# Patient Record
Sex: Female | Born: 1980 | Race: White | Hispanic: No | Marital: Married | Smoking: Never smoker
Health system: Southern US, Community
[De-identification: ages and names within clinical notes are randomized; demographics above are authoritative.]

## PROBLEM LIST (undated history)

## (undated) DIAGNOSIS — Z8742 Personal history of other diseases of the female genital tract: Secondary | ICD-10-CM

## (undated) DIAGNOSIS — Z5189 Encounter for other specified aftercare: Secondary | ICD-10-CM

## (undated) DIAGNOSIS — R519 Headache, unspecified: Secondary | ICD-10-CM

## (undated) DIAGNOSIS — K219 Gastro-esophageal reflux disease without esophagitis: Secondary | ICD-10-CM

## (undated) HISTORY — DX: Gastro-esophageal reflux disease without esophagitis: K21.9

## (undated) HISTORY — DX: Headache, unspecified: R51.9

## (undated) HISTORY — PX: NO PAST SURGERIES: SHX2092

## (undated) HISTORY — DX: Encounter for other specified aftercare: Z51.89

## (undated) HISTORY — DX: Personal history of other diseases of the female genital tract: Z87.42

---

## 2018-08-25 ENCOUNTER — Encounter: Payer: Self-pay | Admitting: Maternal Newborn

## 2018-11-05 NOTE — L&D Delivery Note (Signed)
Delivery Note At  19 a viable female infant was delivered over intact perineum via svd (Presentation:cephalic ;OA  ).  APGAR:9 ,9 ; weight  .8lb 6oz   Placenta status: delivered, spontaneously,intact .  Cord:  3vv, with the following complications: none .  Anesthesia:   Local, lidocaine 1% Episiotomy:  none Lacerations:  third degree, right labial; perineal laceration repaired in standard fashion and hemostatic Suture Repair: 3.0 vicryl, 0 vicryl Est. Blood Loss (mL):  944ml   Good uterine tone, many small bleeding vessels that contributed to the EBL.  Lacerations all hemostatic after repair and good uterine tone, normal lochia at repair end.  Digital rectal exam to confirm third degree, repeat exam after repair and wnl with both exams. Mom to postpartum.  Baby to Couplet care / Skin to Skin.  [name] 09/16/2019, 6:08 PM

## 2019-03-09 LAB — OB RESULTS CONSOLE GC/CHLAMYDIA
Chlamydia: NEGATIVE
Gonorrhea: NEGATIVE

## 2019-03-09 LAB — OB RESULTS CONSOLE HIV ANTIBODY (ROUTINE TESTING): HIV: NONREACTIVE

## 2019-03-09 LAB — OB RESULTS CONSOLE HEPATITIS B SURFACE ANTIGEN: Hepatitis B Surface Ag: NEGATIVE

## 2019-03-09 LAB — OB RESULTS CONSOLE RUBELLA ANTIBODY, IGM: Rubella: IMMUNE

## 2019-03-09 LAB — OB RESULTS CONSOLE RPR: RPR: NONREACTIVE

## 2019-03-09 LAB — OB RESULTS CONSOLE ABO/RH: RH Type: POSITIVE

## 2019-03-09 LAB — OB RESULTS CONSOLE ANTIBODY SCREEN: Antibody Screen: NEGATIVE

## 2019-08-24 LAB — OB RESULTS CONSOLE GBS: GBS: NEGATIVE

## 2019-09-09 ENCOUNTER — Other Ambulatory Visit: Payer: Self-pay | Admitting: Advanced Practice Midwife

## 2019-09-14 ENCOUNTER — Encounter (HOSPITAL_COMMUNITY): Payer: Self-pay | Admitting: *Deleted

## 2019-09-14 ENCOUNTER — Other Ambulatory Visit: Payer: Self-pay | Admitting: Obstetrics and Gynecology

## 2019-09-14 ENCOUNTER — Telehealth (HOSPITAL_COMMUNITY): Payer: Self-pay | Admitting: *Deleted

## 2019-09-14 NOTE — Telephone Encounter (Signed)
Preadmission screen  

## 2019-09-15 ENCOUNTER — Other Ambulatory Visit: Payer: Self-pay

## 2019-09-15 ENCOUNTER — Other Ambulatory Visit (HOSPITAL_COMMUNITY)
Admission: RE | Admit: 2019-09-15 | Discharge: 2019-09-15 | Disposition: A | Discharge status: Home / Self Care | Payer: POS | Source: Ambulatory Visit | Attending: Obstetrics and Gynecology | Admitting: Obstetrics and Gynecology

## 2019-09-15 LAB — SARS CORONAVIRUS 2 (TAT 6-24 HRS): SARS Coronavirus 2: NEGATIVE

## 2019-09-15 NOTE — MAU Note (Signed)
Asymptomatic, swab collected. 

## 2019-09-16 ENCOUNTER — Inpatient Hospital Stay (HOSPITAL_COMMUNITY)
Admission: AD | Admit: 2019-09-16 | Discharge: 2019-09-18 | DRG: 768 | Disposition: A | Discharge status: Home / Self Care | Payer: POS | Attending: Obstetrics and Gynecology | Admitting: Obstetrics and Gynecology

## 2019-09-16 ENCOUNTER — Inpatient Hospital Stay (HOSPITAL_COMMUNITY): Payer: POS | Admitting: Anesthesiology

## 2019-09-16 ENCOUNTER — Encounter (HOSPITAL_COMMUNITY): Payer: Self-pay | Admitting: *Deleted

## 2019-09-16 ENCOUNTER — Encounter (HOSPITAL_COMMUNITY): Payer: Self-pay | Admitting: Anesthesiology

## 2019-09-16 ENCOUNTER — Encounter (HOSPITAL_COMMUNITY)
Admission: AD | Disposition: A | Discharge status: Home / Self Care | Payer: Self-pay | Source: Home / Self Care | Attending: Obstetrics and Gynecology

## 2019-09-16 ENCOUNTER — Inpatient Hospital Stay (HOSPITAL_COMMUNITY): Payer: POS

## 2019-09-16 ENCOUNTER — Other Ambulatory Visit: Payer: Self-pay

## 2019-09-16 DIAGNOSIS — O165 Unspecified maternal hypertension, complicating the puerperium: Secondary | ICD-10-CM | POA: Diagnosis not present

## 2019-09-16 DIAGNOSIS — D62 Acute posthemorrhagic anemia: Secondary | ICD-10-CM | POA: Diagnosis not present

## 2019-09-16 DIAGNOSIS — O3663X0 Maternal care for excessive fetal growth, third trimester, not applicable or unspecified: Secondary | ICD-10-CM | POA: Diagnosis present

## 2019-09-16 DIAGNOSIS — Z3A39 39 weeks gestation of pregnancy: Secondary | ICD-10-CM

## 2019-09-16 DIAGNOSIS — Z20828 Contact with and (suspected) exposure to other viral communicable diseases: Secondary | ICD-10-CM | POA: Diagnosis present

## 2019-09-16 DIAGNOSIS — N898 Other specified noninflammatory disorders of vagina: Secondary | ICD-10-CM | POA: Diagnosis not present

## 2019-09-16 DIAGNOSIS — O9081 Anemia of the puerperium: Secondary | ICD-10-CM | POA: Diagnosis not present

## 2019-09-16 DIAGNOSIS — O9902 Anemia complicating childbirth: Secondary | ICD-10-CM | POA: Diagnosis not present

## 2019-09-16 DIAGNOSIS — O26893 Other specified pregnancy related conditions, third trimester: Secondary | ICD-10-CM | POA: Diagnosis present

## 2019-09-16 DIAGNOSIS — Z3490 Encounter for supervision of normal pregnancy, unspecified, unspecified trimester: Secondary | ICD-10-CM | POA: Diagnosis present

## 2019-09-16 HISTORY — PX: HEMATOMA EVACUATION: SHX5118

## 2019-09-16 HISTORY — PX: PERINEAL LACERATION REPAIR: SHX5389

## 2019-09-16 LAB — CBC
HCT: 28.1 % — ABNORMAL LOW (ref 36.0–46.0)
HCT: 30.8 % — ABNORMAL LOW (ref 36.0–46.0)
HCT: 34.1 % — ABNORMAL LOW (ref 36.0–46.0)
Hemoglobin: 10.3 g/dL — ABNORMAL LOW (ref 12.0–15.0)
Hemoglobin: 11.4 g/dL — ABNORMAL LOW (ref 12.0–15.0)
Hemoglobin: 9.3 g/dL — ABNORMAL LOW (ref 12.0–15.0)
MCH: 28.2 pg (ref 26.0–34.0)
MCH: 28.4 pg (ref 26.0–34.0)
MCH: 28.5 pg (ref 26.0–34.0)
MCHC: 33.1 g/dL (ref 30.0–36.0)
MCHC: 33.4 g/dL (ref 30.0–36.0)
MCHC: 33.4 g/dL (ref 30.0–36.0)
MCV: 84.4 fL (ref 80.0–100.0)
MCV: 85.3 fL (ref 80.0–100.0)
MCV: 85.9 fL (ref 80.0–100.0)
Platelets: 291 10*3/uL (ref 150–400)
Platelets: 307 10*3/uL (ref 150–400)
Platelets: 337 10*3/uL (ref 150–400)
RBC: 3.27 MIL/uL — ABNORMAL LOW (ref 3.87–5.11)
RBC: 3.61 MIL/uL — ABNORMAL LOW (ref 3.87–5.11)
RBC: 4.04 MIL/uL (ref 3.87–5.11)
RDW: 12.4 % (ref 11.5–15.5)
RDW: 12.4 % (ref 11.5–15.5)
RDW: 12.5 % (ref 11.5–15.5)
WBC: 10.1 10*3/uL (ref 4.0–10.5)
WBC: 13.4 10*3/uL — ABNORMAL HIGH (ref 4.0–10.5)
WBC: 20.4 10*3/uL — ABNORMAL HIGH (ref 4.0–10.5)
nRBC: 0 % (ref 0.0–0.2)
nRBC: 0 % (ref 0.0–0.2)
nRBC: 0 % (ref 0.0–0.2)

## 2019-09-16 LAB — COMPREHENSIVE METABOLIC PANEL
ALT: 15 U/L (ref 0–44)
AST: 21 U/L (ref 15–41)
Albumin: 2.7 g/dL — ABNORMAL LOW (ref 3.5–5.0)
Alkaline Phosphatase: 140 U/L — ABNORMAL HIGH (ref 38–126)
Anion gap: 10 (ref 5–15)
BUN: 10 mg/dL (ref 6–20)
CO2: 21 mmol/L — ABNORMAL LOW (ref 22–32)
Calcium: 9.1 mg/dL (ref 8.9–10.3)
Chloride: 105 mmol/L (ref 98–111)
Creatinine, Ser: 0.73 mg/dL (ref 0.44–1.00)
GFR calc Af Amer: >60 mL/min (ref >60)
GFR calc non Af Amer: >60 mL/min (ref >60)
Glucose, Bld: 96 mg/dL (ref 70–99)
Potassium: 3.4 mmol/L — ABNORMAL LOW (ref 3.5–5.1)
Sodium: 136 mmol/L (ref 135–145)
Total Bilirubin: 0.4 mg/dL (ref 0.3–1.2)
Total Protein: 6 g/dL — ABNORMAL LOW (ref 6.5–8.1)

## 2019-09-16 LAB — RPR: RPR Ser Ql: NONREACTIVE

## 2019-09-16 LAB — PREPARE RBC (CROSSMATCH)

## 2019-09-16 LAB — ABO/RH: ABO/RH(D): O POS

## 2019-09-16 SURGERY — SUTURE REPAIR, LACERATION, PERINEUM
Anesthesia: General | Site: Vagina

## 2019-09-16 MED ORDER — SODIUM CHLORIDE 0.9 % IV SOLN
INTRAVENOUS | Status: DC | PRN
  Administered 2019-09-16: 21:00:00 via INTRAVENOUS

## 2019-09-16 MED ORDER — SODIUM CHLORIDE 0.9% IV SOLUTION
Freq: Once | INTRAVENOUS | Status: AC
  Administered 2019-09-17: 10:00:00 via INTRAVENOUS

## 2019-09-16 MED ORDER — LACTATED RINGERS IV SOLN: 500.0000 mL | INTRAVENOUS | Status: DC | PRN

## 2019-09-16 MED ORDER — PROPOFOL 10 MG/ML IV BOLUS
INTRAVENOUS | Status: DC | PRN
  Administered 2019-09-16: 200 mg via INTRAVENOUS

## 2019-09-16 MED ORDER — EPHEDRINE 5 MG/ML INJ: 10.0000 mg | INTRAVENOUS | Status: DC | PRN

## 2019-09-16 MED ORDER — COCONUT OIL OIL: 1.0000 "application " | TOPICAL_OIL | Status: DC | PRN

## 2019-09-16 MED ORDER — ONDANSETRON HCL 4 MG/2ML IJ SOLN: 4.0000 mg | INTRAMUSCULAR | Status: DC | PRN

## 2019-09-16 MED ORDER — PHENYLEPHRINE HCL-NACL 20-0.9 MG/250ML-% IV SOLN
INTRAVENOUS | Status: AC
  Filled 2019-09-16: qty 250

## 2019-09-16 MED ORDER — FENTANYL CITRATE (PF) 100 MCG/2ML IJ SOLN: 100.0000 ug | Freq: Once | INTRAMUSCULAR | Status: DC

## 2019-09-16 MED ORDER — ACETAMINOPHEN 10 MG/ML IV SOLN
INTRAVENOUS | Status: AC
  Filled 2019-09-16: qty 100

## 2019-09-16 MED ORDER — SENNOSIDES-DOCUSATE SODIUM 8.6-50 MG PO TABS
2.0000 | ORAL_TABLET | ORAL | Status: DC
  Administered 2019-09-16 – 2019-09-17 (×2): 2 via ORAL
  Filled 2019-09-16 (×2): qty 2

## 2019-09-16 MED ORDER — WITCH HAZEL-GLYCERIN EX PADS: 1.0000 "application " | MEDICATED_PAD | CUTANEOUS | Status: DC | PRN

## 2019-09-16 MED ORDER — IBUPROFEN 600 MG PO TABS
600.0000 mg | ORAL_TABLET | Freq: Four times a day (QID) | ORAL | Status: DC
  Administered 2019-09-17 – 2019-09-18 (×7): 600 mg via ORAL
  Filled 2019-09-16 (×7): qty 1

## 2019-09-16 MED ORDER — CEFAZOLIN SODIUM-DEXTROSE 2-3 GM-%(50ML) IV SOLR
INTRAVENOUS | Status: DC | PRN
  Administered 2019-09-16: 2 g via INTRAVENOUS

## 2019-09-16 MED ORDER — PROPOFOL 10 MG/ML IV BOLUS
INTRAVENOUS | Status: AC
  Filled 2019-09-16: qty 20

## 2019-09-16 MED ORDER — MISOPROSTOL 25 MCG QUARTER TABLET
25.0000 ug | ORAL_TABLET | ORAL | Status: AC | PRN
  Administered 2019-09-16 (×2): 25 ug via VAGINAL
  Filled 2019-09-16 (×2): qty 1

## 2019-09-16 MED ORDER — PHENYLEPHRINE 40 MCG/ML (10ML) SYRINGE FOR IV PUSH (FOR BLOOD PRESSURE SUPPORT): 80.0000 ug | PREFILLED_SYRINGE | INTRAVENOUS | Status: DC | PRN

## 2019-09-16 MED ORDER — BENZOCAINE-MENTHOL 20-0.5 % EX AERO: 1.0000 "application " | INHALATION_SPRAY | CUTANEOUS | Status: DC | PRN

## 2019-09-16 MED ORDER — SIMETHICONE 80 MG PO CHEW: 80.0000 mg | CHEWABLE_TABLET | ORAL | Status: DC | PRN

## 2019-09-16 MED ORDER — MIDAZOLAM HCL 2 MG/2ML IJ SOLN
INTRAMUSCULAR | Status: DC | PRN
  Administered 2019-09-16: 2 mg via INTRAVENOUS

## 2019-09-16 MED ORDER — MEPERIDINE HCL 25 MG/ML IJ SOLN: 6.2500 mg | INTRAMUSCULAR | Status: DC | PRN

## 2019-09-16 MED ORDER — DEXAMETHASONE SODIUM PHOSPHATE 4 MG/ML IJ SOLN
INTRAMUSCULAR | Status: AC
  Filled 2019-09-16: qty 1

## 2019-09-16 MED ORDER — PHENYLEPHRINE HCL (PRESSORS) 10 MG/ML IV SOLN
INTRAVENOUS | Status: DC | PRN
  Administered 2019-09-16: .08 mg via INTRAVENOUS
  Administered 2019-09-16: .04 mg via INTRAVENOUS

## 2019-09-16 MED ORDER — FENTANYL CITRATE (PF) 100 MCG/2ML IJ SOLN: 50.0000 ug | Freq: Once | INTRAMUSCULAR | Status: DC

## 2019-09-16 MED ORDER — ACETAMINOPHEN 325 MG PO TABS: 650.0000 mg | ORAL_TABLET | ORAL | Status: DC | PRN

## 2019-09-16 MED ORDER — SUGAMMADEX SODIUM 200 MG/2ML IV SOLN
INTRAVENOUS | Status: DC | PRN
  Administered 2019-09-16: 250 mg via INTRAVENOUS

## 2019-09-16 MED ORDER — ONDANSETRON HCL 4 MG/2ML IJ SOLN
INTRAMUSCULAR | Status: DC | PRN
  Administered 2019-09-16: 4 mg via INTRAVENOUS

## 2019-09-16 MED ORDER — FENTANYL CITRATE (PF) 100 MCG/2ML IJ SOLN
INTRAMUSCULAR | Status: AC
  Filled 2019-09-16: qty 2

## 2019-09-16 MED ORDER — LIDOCAINE HCL (PF) 1 % IJ SOLN
30.0000 mL | INTRAMUSCULAR | Status: AC | PRN
  Administered 2019-09-16: 30 mL via SUBCUTANEOUS
  Filled 2019-09-16: qty 30

## 2019-09-16 MED ORDER — OXYTOCIN BOLUS FROM INFUSION
500.0000 mL | Freq: Once | INTRAVENOUS | Status: AC
  Administered 2019-09-16: 500 mL via INTRAVENOUS

## 2019-09-16 MED ORDER — ACETAMINOPHEN 10 MG/ML IV SOLN
INTRAVENOUS | Status: DC | PRN
  Administered 2019-09-16: 1000 mg via INTRAVENOUS

## 2019-09-16 MED ORDER — OXYTOCIN 40 UNITS IN NORMAL SALINE INFUSION - SIMPLE MED: 1.0000 m[IU]/min | INTRAVENOUS | Status: DC

## 2019-09-16 MED ORDER — LACTATED RINGERS IV SOLN
500.0000 mL | Freq: Once | INTRAVENOUS | Status: AC
  Administered 2019-09-16: 500 mL via INTRAVENOUS

## 2019-09-16 MED ORDER — LACTATED RINGERS IV SOLN
INTRAVENOUS | Status: DC | PRN
  Administered 2019-09-16 (×2): via INTRAVENOUS

## 2019-09-16 MED ORDER — FENTANYL-BUPIVACAINE-NACL 0.5-0.125-0.9 MG/250ML-% EP SOLN
12.0000 mL/h | EPIDURAL | Status: DC | PRN
  Filled 2019-09-16: qty 250

## 2019-09-16 MED ORDER — LACTATED RINGERS IV SOLN
INTRAVENOUS | Status: DC
  Administered 2019-09-16: 01:00:00 via INTRAVENOUS

## 2019-09-16 MED ORDER — SUCCINYLCHOLINE CHLORIDE 200 MG/10ML IV SOSY
PREFILLED_SYRINGE | INTRAVENOUS | Status: AC
  Filled 2019-09-16: qty 10

## 2019-09-16 MED ORDER — PRENATAL MULTIVITAMIN CH
1.0000 | ORAL_TABLET | Freq: Every day | ORAL | Status: DC
  Administered 2019-09-17 – 2019-09-18 (×2): 1 via ORAL
  Filled 2019-09-16 (×2): qty 1

## 2019-09-16 MED ORDER — PROMETHAZINE HCL 25 MG/ML IJ SOLN: 6.2500 mg | INTRAMUSCULAR | Status: DC | PRN

## 2019-09-16 MED ORDER — DIPHENHYDRAMINE HCL 50 MG/ML IJ SOLN: 12.5000 mg | INTRAMUSCULAR | Status: DC | PRN

## 2019-09-16 MED ORDER — ROCURONIUM 10MG/ML (10ML) SYRINGE FOR MEDFUSION PUMP - OPTIME
INTRAVENOUS | Status: DC | PRN
  Administered 2019-09-16: 30 mg via INTRAVENOUS

## 2019-09-16 MED ORDER — TETANUS-DIPHTH-ACELL PERTUSSIS 5-2.5-18.5 LF-MCG/0.5 IM SUSP: 0.5000 mL | Freq: Once | INTRAMUSCULAR | Status: DC

## 2019-09-16 MED ORDER — OXYCODONE-ACETAMINOPHEN 5-325 MG PO TABS: 2.0000 | ORAL_TABLET | ORAL | Status: DC | PRN

## 2019-09-16 MED ORDER — TERBUTALINE SULFATE 1 MG/ML IJ SOLN: 0.2500 mg | Freq: Once | INTRAMUSCULAR | Status: DC | PRN

## 2019-09-16 MED ORDER — ONDANSETRON HCL 4 MG/2ML IJ SOLN
INTRAMUSCULAR | Status: AC
  Filled 2019-09-16: qty 2

## 2019-09-16 MED ORDER — ONDANSETRON HCL 4 MG PO TABS: 4.0000 mg | ORAL_TABLET | ORAL | Status: DC | PRN

## 2019-09-16 MED ORDER — ROCURONIUM BROMIDE 10 MG/ML (PF) SYRINGE
PREFILLED_SYRINGE | INTRAVENOUS | Status: AC
  Filled 2019-09-16: qty 10

## 2019-09-16 MED ORDER — FENTANYL CITRATE (PF) 100 MCG/2ML IJ SOLN
50.0000 ug | Freq: Once | INTRAMUSCULAR | Status: AC
  Administered 2019-09-16: 50 ug via INTRAVENOUS
  Filled 2019-09-16: qty 2

## 2019-09-16 MED ORDER — FENTANYL CITRATE (PF) 100 MCG/2ML IJ SOLN
INTRAMUSCULAR | Status: AC
  Administered 2019-09-16: 100 ug
  Filled 2019-09-16: qty 2

## 2019-09-16 MED ORDER — FENTANYL CITRATE (PF) 100 MCG/2ML IJ SOLN
INTRAMUSCULAR | Status: DC | PRN
  Administered 2019-09-16 (×2): 100 ug via INTRAVENOUS

## 2019-09-16 MED ORDER — ROCURONIUM BROMIDE 100 MG/10ML IV SOLN
INTRAVENOUS | Status: DC | PRN
  Administered 2019-09-16: 30 mg via INTRAVENOUS

## 2019-09-16 MED ORDER — LIDOCAINE HCL (PF) 1 % IJ SOLN
INTRAMUSCULAR | Status: AC
  Filled 2019-09-16: qty 30

## 2019-09-16 MED ORDER — HYDROMORPHONE HCL 1 MG/ML IJ SOLN
INTRAMUSCULAR | Status: DC | PRN
  Administered 2019-09-16: 1 mg via INTRAVENOUS

## 2019-09-16 MED ORDER — DEXAMETHASONE SODIUM PHOSPHATE 4 MG/ML IJ SOLN
INTRAMUSCULAR | Status: DC | PRN
  Administered 2019-09-16: 4 mg via INTRAVENOUS

## 2019-09-16 MED ORDER — OXYTOCIN 40 UNITS IN NORMAL SALINE INFUSION - SIMPLE MED
2.5000 [IU]/h | INTRAVENOUS | Status: DC
  Filled 2019-09-16: qty 1000

## 2019-09-16 MED ORDER — MISOPROSTOL 50MCG HALF TABLET
50.0000 ug | ORAL_TABLET | ORAL | Status: DC
  Administered 2019-09-16: 50 ug via BUCCAL
  Filled 2019-09-16: qty 1

## 2019-09-16 MED ORDER — BUTORPHANOL TARTRATE 1 MG/ML IJ SOLN
2.0000 mg | Freq: Once | INTRAMUSCULAR | Status: AC
  Administered 2019-09-16: 2 mg via INTRAVENOUS
  Filled 2019-09-16: qty 2

## 2019-09-16 MED ORDER — SUCCINYLCHOLINE CHLORIDE 20 MG/ML IJ SOLN
INTRAMUSCULAR | Status: DC | PRN
  Administered 2019-09-16: 160 mg via INTRAVENOUS

## 2019-09-16 MED ORDER — HYDROMORPHONE HCL 1 MG/ML IJ SOLN
INTRAMUSCULAR | Status: AC
  Filled 2019-09-16: qty 1

## 2019-09-16 MED ORDER — ONDANSETRON HCL 4 MG/2ML IJ SOLN
4.0000 mg | Freq: Four times a day (QID) | INTRAMUSCULAR | Status: DC | PRN
  Administered 2019-09-16: 4 mg via INTRAVENOUS
  Filled 2019-09-16: qty 2

## 2019-09-16 MED ORDER — CEFAZOLIN SODIUM-DEXTROSE 2-4 GM/100ML-% IV SOLN
INTRAVENOUS | Status: AC
  Filled 2019-09-16: qty 100

## 2019-09-16 MED ORDER — DIPHENHYDRAMINE HCL 25 MG PO CAPS: 25.0000 mg | ORAL_CAPSULE | Freq: Four times a day (QID) | ORAL | Status: DC | PRN

## 2019-09-16 MED ORDER — LIDOCAINE HCL (CARDIAC) PF 100 MG/5ML IV SOSY
PREFILLED_SYRINGE | INTRAVENOUS | Status: DC | PRN
  Administered 2019-09-16: 100 mg via INTRAVENOUS

## 2019-09-16 MED ORDER — LIDOCAINE 2% (20 MG/ML) 5 ML SYRINGE
INTRAMUSCULAR | Status: AC
  Filled 2019-09-16: qty 5

## 2019-09-16 MED ORDER — SOD CITRATE-CITRIC ACID 500-334 MG/5ML PO SOLN
30.0000 mL | ORAL | Status: DC | PRN
  Administered 2019-09-16: 30 mL via ORAL
  Filled 2019-09-16: qty 30

## 2019-09-16 MED ORDER — OXYCODONE-ACETAMINOPHEN 5-325 MG PO TABS
1.0000 | ORAL_TABLET | ORAL | Status: DC | PRN
  Administered 2019-09-17 – 2019-09-18 (×3): 1 via ORAL
  Filled 2019-09-16 (×3): qty 1

## 2019-09-16 MED ORDER — HYDROMORPHONE HCL 1 MG/ML IJ SOLN: 0.2500 mg | INTRAMUSCULAR | Status: DC | PRN

## 2019-09-16 MED ORDER — MIDAZOLAM HCL 2 MG/2ML IJ SOLN
INTRAMUSCULAR | Status: AC
  Filled 2019-09-16: qty 2

## 2019-09-16 MED ORDER — DIBUCAINE (PERIANAL) 1 % EX OINT: 1.0000 "application " | TOPICAL_OINTMENT | CUTANEOUS | Status: DC | PRN

## 2019-09-16 MED ORDER — FENTANYL CITRATE (PF) 250 MCG/5ML IJ SOLN
INTRAMUSCULAR | Status: AC
  Filled 2019-09-16: qty 5

## 2019-09-16 SURGICAL SUPPLY — 1 items: BANDAGE GAUZE 4  KLING STR (GAUZE/BANDAGES/DRESSINGS) ×4 IMPLANT

## 2019-09-16 NOTE — Progress Notes (Signed)
Called by nursing about worsening of patient's pain Noted hematoma in vagina  Patient Vitals for the past 24 hrs:  BP Temp Temp src Pulse Resp Height Weight  09/16/19 1931 (!) 122/59 - - 78 18 - -  09/16/19 1916 125/67 - - 76 - - -  09/16/19 1846 137/84 - - 89 - - -  09/16/19 1839 127/69 - - 66 - - -  09/16/19 1816 119/65 - - 78 - - -  09/16/19 1801 114/61 - - 67 - - -  09/16/19 1747 (!) 115/50 - - 71 - - -  09/16/19 1731 117/71 - - 73 - - -  09/16/19 1715 119/79 - - 70 - - -  09/16/19 1424 (!) 148/75 - - 75 - - -  09/16/19 1104 (!) 152/80 - - 73 - - -  09/16/19 0359 132/67 98.5 F (36.9 C) Oral 69 17 - -  09/16/19 0116 - 98.4 F (36.9 C) Oral - - - -  09/16/19 0043 (!) 154/81 - - 72 18 - -  09/16/19 0010 - - - - - 5\' 5"  (1.651 m) 115.6 kg   A&ox3, moaning, very uncomfortable Pelvic: palpable hematoma of left sidewall expanding toward the midline, bleeding noted on pad, new Very uncomfortable with the pain  A/P:  1. Large vaginal sidewall hematoma - recommend to take to the OR for EUA, possible further repair of laceration if not hemostatic; I have reviewed the procedure with the patient and consent signed; repeat cbc is pending; pt did have significant ebl with her laceration and have a low threshold for blood transfusion; understands risk for anesthesia, further complications could include blood clot to lungs/legs, risk for further surgery pending findings.

## 2019-09-16 NOTE — Progress Notes (Addendum)
S: At pt BS per RN request to assess labor and continued POC for IOL. Patient desires to eat breakfast. Feeling well, comfortable with mild ctx after 2 doses of Cytotec 25 mcg vaginal overnight.  IOL at term. LGA  O: Vitals:   09/16/19 0010 09/16/19 0043 09/16/19 0116 09/16/19 0359  BP:  (!) 154/81  132/67  Pulse:  72  69  Resp:  18  17  Temp:   98.4 F (36.9 C) 98.5 F (36.9 C)  TempSrc:   Oral Oral  Weight: 115.6 kg     Height: 5\' 5"  (1.651 m)        FHT:  FHR: 130 bpm, variability: moderate,  accelerations:  Present,  decelerations:  Absent UC:   irregular, every 3-5 minutes SVE:   Dilation: 1.5 Effacement (%): 50 Station: -2 Exam by:: Machell Wirthlin cnm Cervical balloon placed, 60 cc of fluid inflated, patient tolerated well.  A / P: G1P0 at [redacted]w[redacted]d   Induction of labor - term, LGA S/P cytotec 25 mcg x 2, minimal effect Continue ripening with cervical balloon and Cytotec 50 mcg buccal Plan Pitocin and AROM when favorable  Fetal Wellbeing:  Category I Pain Control:  Labor support without medications  Anticipated MOD:  NSVD  Will update Dr. Pamala Hurry with [place], CNM, MSN 09/16/2019, 8:26 AM

## 2019-09-16 NOTE — Op Note (Signed)
Preoperative diagnosis:  Post partum vaginal hematoma Postop diagnosis: as above.  Procedure: EUA, evacuation of hematoma Anesthesia General endotracheal. Surgeon: Tiana Loft, MD Assistant: none  IV fluids : 1800,ml; 1 unit prbc0 Estimated blood loss: 15ml Urine output:  Foley catheter Complications none Condition stable Disposition PACU  Specimen: none  Procedure  The patient was examined in the labor room and large left vaginal sidewall hematoma noted.  Recommendation to proceed to the OR for exam and likely evacuation of hematoma.  Consent signed.  The patient was given 2 Gram ancef before procedure start.  The patient was then placed under general anesthesia without difficulty, then positioned in the dorsal lithotomy position, perineum prepped.  Foley catheter placed.  Attention then drawn to the vagina and BME performed.  Hematoma palpable extending toward the midline and clot immediately expressed from sidewall and decompresison on hematoma noted.  Small clots were expressed.  The edges of the vagina were hemostatic.  The vagina was examined further with sterile speculums and sidewall retractor.  No other areas of bleeding noted, and laceration was hemostatic.  No other hematomas noted.  The cervix appeared wnl.  The hematoma was again compressed w/o much further clot to be expressed.  Packing was then placed in the vagina to maintain hemostasis.  The patient tolerated the procedure well.  Sponge, lap, needle counts correct x2. The patient was taken to recovery room in stable condition.

## 2019-09-16 NOTE — H&P (Addendum)
Taylor Fitzpatrick is a 38 y.o. G1P0 at [redacted]w[redacted]d gestation presents for IOL.  Now with contractions, feeling them more.  Wanting unmediated birth.     Antepartum course: ama, nml genetic testing, LGA  [facility] at [facility] since 12 wks.  Temp:  [98.4 F (36.9 C)-98.5 F (36.9 C)] 98.5 F (36.9 C) (11/11 0359) Pulse Rate:  [69-73] 73 (11/11 1104) Resp:  [17-18] 17 (11/11 0359) BP: (132-154)/(67-81) 152/80 (11/11 1104) Weight:  [115.6 kg] 115.6 kg (11/11 0010)   See complete pre-natal records  History OB History    Gravida  1   Para      Term      Preterm      AB      Living        SAB      TAB      Ectopic      Multiple      Live Births             Past Medical History:  Diagnosis Date  . GERD (gastroesophageal reflux disease)   . Headache   . History of ovarian cyst     Family History: family history includes Atrial fibrillation in her father and paternal grandfather; Endometriosis in her mother and sister; Hyperlipidemia in her mother; Hypertension in her mother. Social History:  reports that she has never smoked. She has never used smokeless tobacco. She reports previous alcohol use. She reports that she does not use drugs.  ROS: See above otherwise negative  Prenatal labs:  ABO, Rh: --/--/O POS, O POS Performed at [facility], [place] [address], [place], [place] [address]  [phone]) Antibody: NEG (11/11 0025) Rubella: Immune (05/04 0000) RPR: NON REACTIVE (11/11 0028)  HBsAg: Negative (05/04 0000)  HIV:Non-reactive (05/04 0000)  GBS: Negative/-- (10/19 0000)  1 hr Glucola: Normal Genetic screening: Normal Anatomy US: Normal  Physical Exam:   Dilation: 6.5 Effacement (%): 80 Station: -2, -1 Exam by:: m wilkins rnc Blood pressure (!) 152/80, pulse 73, temperature 98.5 F (36.9 C), temperature source Oral, resp. rate 17, height 5\' 5"  (1.651 m), weight 115.6 kg. A&O x 3 HEENT: Normal Lungs: CTAB CV: RRR Abdominal: Soft,  Non-tender, Gravid and Estimated fetal weight: 9 lbs  Lower Extremities: Non-edematous, Non-tender  Pelvic Exam:      Dilatation: 8cm     Effacement: 80%     Station: -2     Presentation: Cephalic; arom with clear fluid, copious amount  Labs:  CBC:  Lab Results  Component Value Date   WBC 10.1 09/16/2019   RBC 3.61 (L) 09/16/2019   HGB 10.3 (L) 09/16/2019   HCT 30.8 (L) 09/16/2019   MCV 85.3 09/16/2019   MCH 28.5 09/16/2019   MCHC 33.4 09/16/2019   RDW 12.4 09/16/2019   PLT 291 09/16/2019   CMP: No results found for: NA, K, CL, CO2, GLUCOSE, BUN, CREATININE, CALCIUM, PROT, AST, ALT, ALBUMIN, ALKPHOS, BILITOT, GFRNONAA, GFRAA, ANIONGAP Urine: No results found for: COLORURINE, APPEARANCEUR, LABSPEC, PHURINE, GLUCOSEU, HGBUR, BILIRUBINUR, KETONESUR, PROTEINUR, NITRITE, LEUKOCYTESUR  FHT: 130s, nml variability, +accels, no decels TOCO: q 2 min   Prenatal Transfer Tool  Maternal Diabetes: No Genetic Screening: Normal Maternal Ultrasounds/Referrals: Normal Fetal Ultrasounds or other Referrals:  None Maternal Substance Abuse:  No Significant Maternal Medications:  None Significant Maternal Lab Results: Group B Strep negative    Assessment/Plan:  38 y.o. G1P0 at [redacted]w[redacted]d gestation   1. Active labor: now s/p arom, monitor to see  if adequate and continue plan for svd; augment with pitocin if needed 2. gbs neg 3. ama - neg screening 4. LGA, pt aware of risk for shoulder dystocia, will make delivery team aware of risk  5.   Mild range bp, asymptomatic, will send labs, will contin to monitor Vick Frees 09/16/2019, 2:16 PM

## 2019-09-16 NOTE — Anesthesia Procedure Notes (Addendum)
Procedure Name: Intubation Date/Time: 09/16/2019 8:25 PM Performed by: Nolon Nations, MD Pre-anesthesia Checklist: Patient identified, Patient being monitored, Timeout performed, Emergency Drugs available and Suction available Patient Re-evaluated:Patient Re-evaluated prior to induction Oxygen Delivery Method: Circle System Utilized Preoxygenation: Pre-oxygenation with 100% oxygen Induction Type: IV induction, Cricoid Pressure applied and Rapid sequence Laryngoscope Size: 3 and Glidescope Grade View: Grade I Tube type: Oral Tube size: 7.0 mm Number of attempts: 1 Airway Equipment and Method: Video-laryngoscopy Placement Confirmation: ETT inserted through vocal cords under direct vision,  positive ETCO2 and breath sounds checked- equal and bilateral Secured at: 22 cm Tube secured with: Tape Dental Injury: Teeth and Oropharynx as per pre-operative assessment

## 2019-09-16 NOTE — Progress Notes (Signed)
10 instruments 2 injectables 5 9x9 sponges 5 4x18 sponges MO

## 2019-09-16 NOTE — Progress Notes (Signed)
S: Patient working harder w/ ctx, using birthing ball and changing positions. Desires unmedicated birth as much as possible. Spouse present and supportive.   S/P cervical balloon (expulsed at 11am) and oral miso 50 mcg at 0900.   O: Vitals:   09/16/19 0359 09/16/19 1104  BP: 132/67 (!) 152/80  Pulse: 69 73  Resp: 17   Temp: 98.5 F (36.9 C)      FHT 135, moderate variability, + accels, no decels Toco: q 2-3 min, mod to palp  Dilation: 5.5 Effacement (%): 80 Cervical Position: Middle Station: -2 Presentation: Vertex Exam by:: m wilkins rnc   A/P: IOL at term LGA Cervical ripening effective with CB and misoprostol Fetal status reassuring  Anticipate physiologic labor Pitocin PRN for latency Intermittent EFM for now Encouraged position changes  Juliene Pina, MSN, CNM 09/16/2019, 11:56 AM

## 2019-09-16 NOTE — Progress Notes (Signed)
Dr Murrell Redden notified of patient's uncontrolled pain. SVE sutures intact. Moderate to large hematoma in back of vagina. almquist and anesthesia notified.

## 2019-09-16 NOTE — Transfer of Care (Signed)
Immediate Anesthesia Transfer of Care Note  Patient: Taylor Fitzpatrick  Procedure(s) Performed: Exam Under Anesthesia (N/A )  Patient Location: PACU  Anesthesia Type:General  Level of Consciousness: awake, alert  and oriented  Airway & Oxygen Therapy: Patient Spontanous Breathing and Patient connected to nasal cannula oxygen  Post-op Assessment: Report given to RN and Post -op Vital signs reviewed and stable  Post vital signs: Reviewed and stable  Last Vitals:  Vitals Value Taken Time  BP 117/57 09/16/19 2145  Temp    Pulse 82 09/16/19 2146  Resp 14 09/16/19 2146  SpO2 98 % 09/16/19 2146  Vitals shown include unvalidated device data.  Last Pain:  Vitals:   09/16/19 2130  TempSrc: Oral  PainSc:          Complications: No apparent anesthesia complications

## 2019-09-16 NOTE — Anesthesia Preprocedure Evaluation (Signed)
Anesthesia Evaluation  Patient identified by MRN, date of birth, ID band Patient awake    Reviewed: Allergy & Precautions, NPO status , Patient's Chart, lab work & pertinent test results  Airway Mallampati: II  TM Distance: >3 FB Neck ROM: Full    Dental no notable dental hx.    Pulmonary neg pulmonary ROS,    Pulmonary exam normal breath sounds clear to auscultation       Cardiovascular negative cardio ROS Normal cardiovascular exam Rhythm:Regular Rate:Normal     Neuro/Psych  Headaches, negative psych ROS   GI/Hepatic Neg liver ROS, GERD  ,  Endo/Other  negative endocrine ROS  Renal/GU negative Renal ROS     Musculoskeletal negative musculoskeletal ROS (+)   Abdominal   Peds  Hematology negative hematology ROS (+)   Anesthesia Other Findings   Reproductive/Obstetrics negative OB ROS                             Anesthesia Physical Anesthesia Plan  ASA: III and emergent  Anesthesia Plan: General   Post-op Pain Management:    Induction: Intravenous, Rapid sequence and Cricoid pressure planned  PONV Risk Score and Plan: 4 or greater and Ondansetron, Dexamethasone, Treatment may vary due to age or medical condition, Midazolam and Scopolamine patch - Pre-op  Airway Management Planned: Oral ETT and Video Laryngoscope Planned  Additional Equipment: None  Intra-op Plan:   Post-operative Plan: Extubation in OR  Informed Consent: I have reviewed the patients History and Physical, chart, labs and discussed the procedure including the risks, benefits and alternatives for the proposed anesthesia with the patient or authorized representative who has indicated his/her understanding and acceptance.     Dental advisory given  Plan Discussed with: CRNA  Anesthesia Plan Comments:         Anesthesia Quick Evaluation

## 2019-09-17 ENCOUNTER — Encounter (HOSPITAL_COMMUNITY): Payer: Self-pay | Admitting: Obstetrics and Gynecology

## 2019-09-17 DIAGNOSIS — N898 Other specified noninflammatory disorders of vagina: Secondary | ICD-10-CM | POA: Diagnosis not present

## 2019-09-17 DIAGNOSIS — O9902 Anemia complicating childbirth: Secondary | ICD-10-CM | POA: Diagnosis not present

## 2019-09-17 LAB — CBC
HCT: 21.5 % — ABNORMAL LOW (ref 36.0–46.0)
HCT: 23.7 % — ABNORMAL LOW (ref 36.0–46.0)
Hemoglobin: 7.2 g/dL — ABNORMAL LOW (ref 12.0–15.0)
Hemoglobin: 8 g/dL — ABNORMAL LOW (ref 12.0–15.0)
MCH: 28.3 pg (ref 26.0–34.0)
MCH: 29 pg (ref 26.0–34.0)
MCHC: 33.5 g/dL (ref 30.0–36.0)
MCHC: 33.8 g/dL (ref 30.0–36.0)
MCV: 84.6 fL (ref 80.0–100.0)
MCV: 85.9 fL (ref 80.0–100.0)
Platelets: 244 10*3/uL (ref 150–400)
Platelets: 228 10*3/uL (ref 150–400)
RBC: 2.54 MIL/uL — ABNORMAL LOW (ref 3.87–5.11)
RBC: 2.76 MIL/uL — ABNORMAL LOW (ref 3.87–5.11)
RDW: 13 % (ref 11.5–15.5)
RDW: 13.7 % (ref 11.5–15.5)
WBC: 12.8 10*3/uL — ABNORMAL HIGH (ref 4.0–10.5)
WBC: 12.5 10*3/uL — ABNORMAL HIGH (ref 4.0–10.5)
nRBC: 0 % (ref 0.0–0.2)
nRBC: 0 % (ref 0.0–0.2)

## 2019-09-17 MED ORDER — MAGNESIUM OXIDE 400 (241.3 MG) MG PO TABS
400.0000 mg | ORAL_TABLET | Freq: Every day | ORAL | Status: DC
  Administered 2019-09-17 – 2019-09-18 (×2): 400 mg via ORAL
  Filled 2019-09-17 (×2): qty 1

## 2019-09-17 MED ORDER — POLYSACCHARIDE IRON COMPLEX 150 MG PO CAPS: 150.0000 mg | ORAL_CAPSULE | Freq: Every day | ORAL | Status: DC

## 2019-09-17 MED ORDER — SODIUM CHLORIDE 0.9 % IV SOLN
1000.0000 mg | Freq: Once | INTRAVENOUS | Status: AC
  Administered 2019-09-18: 1000 mg via INTRAVENOUS
  Filled 2019-09-17 (×2): qty 20

## 2019-09-17 MED ORDER — SODIUM CHLORIDE 0.9 % IV SOLN
25.0000 mg | Freq: Once | INTRAVENOUS | Status: AC
  Administered 2019-09-18: 25 mg via INTRAVENOUS
  Filled 2019-09-17 (×2): qty 0.5

## 2019-09-17 MED ORDER — DOCUSATE SODIUM 100 MG PO CAPS
100.0000 mg | ORAL_CAPSULE | Freq: Every day | ORAL | Status: DC
  Administered 2019-09-17 – 2019-09-18 (×2): 100 mg via ORAL
  Filled 2019-09-17 (×2): qty 1

## 2019-09-17 NOTE — Anesthesia Postprocedure Evaluation (Signed)
Anesthesia Post Note  Patient: Taylor Fitzpatrick  Procedure(s) Performed: Exam Under Anesthesia (N/A )     Patient location during evaluation: PACU Anesthesia Type: General Level of consciousness: sedated and patient cooperative Pain management: pain level controlled Vital Signs Assessment: post-procedure vital signs reviewed and stable Respiratory status: spontaneous breathing Cardiovascular status: stable Anesthetic complications: no    Last Vitals:  Vitals:   09/16/19 2340 09/17/19 0345  BP: 138/78 (!) 137/91  Pulse: 72 84  Resp: 16 16  Temp: 36.7 C 37.1 C  SpO2: 97% 96%    Last Pain:  Vitals:   09/17/19 0345  TempSrc: Oral  PainSc: 0-No pain   Pain Goal:                   Nolon Nations

## 2019-09-17 NOTE — Progress Notes (Addendum)
Feels like a different person, much more energy, still fatigued Had some pain this am, not wanting more than ibuprofen  No lightheadedness/dizziness Breastfeeding Wants circ, reviewed r/b/a  Patient Vitals for the past 24 hrs:  BP Temp Temp src Pulse Resp SpO2  09/17/19 1018 135/80 98.9 F (37.2 C) Oral (!) 105 18 96 %  09/17/19 0951 134/69 99.2 F (37.3 C) Oral (!) 104 20 96 %  09/17/19 0906 (!) 144/83 99.2 F (37.3 C) Oral 84 20 97 %  09/17/19 0345 (!) 137/91 98.8 F (37.1 C) Oral 84 16 96 %  09/16/19 2340 138/78 98 F (36.7 C) Oral 72 16 97 %  09/16/19 2240 119/76 97.9 F (36.6 C) Axillary 69 16 97 %  09/16/19 2230 111/76 - - (!) 105 18 98 %  09/16/19 2215 122/67 - - 69 (!) 22 97 %  09/16/19 2200 116/77 98.2 F (36.8 C) - 84 (!) 30 97 %  09/16/19 2145 (!) 117/57 - - 68 15 99 %  09/16/19 2130 133/76 98.8 F (37.1 C) Oral (!) 105 (!) 21 96 %  09/16/19 2129 - - - (!) 122 (!) 21 97 %  09/16/19 2127 135/73 - - - - -  09/16/19 2018 - - - 90 18 98 %  09/16/19 2010 - - - 92 20 97 %  09/16/19 2000 - - - 81 18 98 %  09/16/19 1946 (!) 134/91 - - 93 16 -  09/16/19 1931 (!) 122/59 - - 78 18 -  09/16/19 1916 125/67 - - 76 - -  09/16/19 1902 105/77 - - 70 18 -  09/16/19 1846 137/84 - - 89 - -  09/16/19 1839 127/69 - - 66 - -  09/16/19 1816 119/65 - - 78 - -  09/16/19 1801 114/61 - - 67 - -  09/16/19 1747 (!) 115/50 - - 71 - -  09/16/19 1731 117/71 - - 73 - -  09/16/19 1715 119/79 - - 70 - -  09/16/19 1424 (!) 148/75 - - 75 - -   A&ox3 rrr ctab Abd: soft, nt, nd; fundus firm and 1cm below umb LE: no edema, nt bilat Pelvic: vaginal packing removed; saturated with old blood, old small clot noted; no active bleeding  CBC Latest Ref Rng & Units 09/17/2019 09/16/2019 09/16/2019  WBC 4.0 - 10.5 K/uL 12.8(H) 20.4(H) 13.4(H)  Hemoglobin 12.0 - 15.0 g/dL 7.2(L) 9.3(L) 11.4(L)  Hematocrit 36.0 - 46.0 % 21.5(L) 28.1(L) 34.1(L)  Platelets 150 - 400 K/uL 244 307 337   A/P: ppd 1 s/p  svd with pph; pod 1 s/p hematoma evacuation 1. Acute severe anemia - s/p 1 unit PRBC, receiving second unit now; will monitor closely for any further bleeding or symptoms of anemia; doing well, though, anticipate continued improvement with second unit blood; plan for iv iron tomorrow if continues to be stable 2. Postpartum - contin routine care, breastfeeding 3. Boy - wants circ; consent signed 4. ama  Gentle vaginal exam - no hematoma palpable left sidewall No active bleeding at perineum

## 2019-09-17 NOTE — Lactation Note (Signed)
This note was copied from a baby's chart. Lactation Consultation Note  Patient Name: Taylor Fitzpatrick [id number] Date: 09/17/2019 Reason for consult: Follow-up assessment Baby is 102 hours old.  Mom reports that baby is latching and feeding well.  Discussed cluster feeding.  Instructed to continue to feed with cues and call for assist prn.  Maternal Data    Feeding Feeding Type: Breast Fed  LATCH Score Latch: Grasps breast easily, tongue down, lips flanged, rhythmical sucking.  Audible Swallowing: A few with stimulation  Type of Nipple: Everted at rest and after stimulation  Comfort (Breast/Nipple): Soft / non-tender  Hold (Positioning): No assistance needed to correctly position infant at breast.  LATCH Score: 9  Interventions    Lactation Tools Discussed/Used     Consult Status Consult Status: Follow-up Date: 09/18/19 Follow-up type: In-patient    Ave Filter 09/17/2019, 2:45 PM

## 2019-09-17 NOTE — Lactation Note (Signed)
This note was copied from a baby's chart. Lactation Consultation Note Baby 37 hrs old. Baby in football position BF, swallows heard. Baby has cephalhematoma. Encouraged mom to hold at the base of his neck and not his head. Baby feeding well. Adjusted latch. Baby needed to come closer to mom, cheeks to breast. Newborn feeding habits, STS, I&O, breast massage, positioning, support, and comfort discussed. Baby came off, Rt. Nipple in raspberry in appearance. ask mom if it hurt during feeding, mom stated she didn't know if her nipple looked like that before. Mom encouraged to feed baby 8-12 times/24 hours and with feeding cues.  Mom really sleepy. Baby fell sleep soundly. Mom has Large pendulous breast w/everted nipples. Encouraged mom to call for assistance or questions. Lactation brochure given.  Patient Name: Taylor Fitzpatrick [id number] Date: 09/17/2019 Reason for consult: Initial assessment;Primapara;Term   Maternal Data Has patient been taught Hand Expression?: Yes Does the patient have breastfeeding experience prior to this delivery?: No  Feeding Feeding Type: Breast Fed  LATCH Score Latch: Grasps breast easily, tongue down, lips flanged, rhythmical sucking.  Audible Swallowing: Spontaneous and intermittent  Type of Nipple: Everted at rest and after stimulation  Comfort (Breast/Nipple): Soft / non-tender  Hold (Positioning): Assistance needed to correctly position infant at breast and maintain latch.  LATCH Score: 9  Interventions Interventions: Breast feeding basics reviewed;Assisted with latch;Support pillows;Skin to skin;Position options;Breast massage;Hand express;Breast compression  Lactation Tools Discussed/Used WIC Program: No   Consult Status Consult Status: Follow-up Date: 09/18/19 Follow-up type: In-patient    [name], Elta Gu[place] 09/17/2019, 4:20 AM

## 2019-09-18 DIAGNOSIS — O165 Unspecified maternal hypertension, complicating the puerperium: Secondary | ICD-10-CM | POA: Diagnosis present

## 2019-09-18 LAB — COMPREHENSIVE METABOLIC PANEL
ALT: 14 U/L (ref 0–44)
AST: 21 U/L (ref 15–41)
Albumin: 2.1 g/dL — ABNORMAL LOW (ref 3.5–5.0)
Alkaline Phosphatase: 85 U/L (ref 38–126)
Anion gap: 9 (ref 5–15)
BUN: 8 mg/dL (ref 6–20)
CO2: 24 mmol/L (ref 22–32)
Calcium: 8 mg/dL — ABNORMAL LOW (ref 8.9–10.3)
Chloride: 106 mmol/L (ref 98–111)
Creatinine, Ser: 0.58 mg/dL (ref 0.44–1.00)
GFR calc Af Amer: >60 mL/min (ref >60)
GFR calc non Af Amer: >60 mL/min (ref >60)
Glucose, Bld: 86 mg/dL (ref 70–99)
Potassium: 3.4 mmol/L — ABNORMAL LOW (ref 3.5–5.1)
Sodium: 139 mmol/L (ref 135–145)
Total Bilirubin: 0.3 mg/dL (ref 0.3–1.2)
Total Protein: 4.7 g/dL — ABNORMAL LOW (ref 6.5–8.1)

## 2019-09-18 LAB — TYPE AND SCREEN
ABO/RH(D): O POS
Antibody Screen: NEGATIVE
Unit division: 0
Unit division: 0

## 2019-09-18 LAB — BPAM RBC
Blood Product Expiration Date: 202012162359
Blood Product Expiration Date: 202012162359
ISSUE DATE / TIME: 202011112050
ISSUE DATE / TIME: 202011120939
Unit Type and Rh: 5100
Unit Type and Rh: 5100

## 2019-09-18 MED ORDER — NIFEDIPINE ER OSMOTIC RELEASE 30 MG PO TB24
30.0000 mg | ORAL_TABLET | Freq: Every day | ORAL | Status: DC
  Administered 2019-09-18: 30 mg via ORAL
  Filled 2019-09-18: qty 1

## 2019-09-18 MED ORDER — COCONUT OIL OIL: 1.0000 "application " | TOPICAL_OIL | 0 refills | Status: DC | PRN

## 2019-09-18 MED ORDER — IBUPROFEN 600 MG PO TABS: 600.0000 mg | ORAL_TABLET | Freq: Four times a day (QID) | ORAL | 0 refills | Status: DC

## 2019-09-18 MED ORDER — POLYSACCHARIDE IRON COMPLEX 150 MG PO CAPS: 150.0000 mg | ORAL_CAPSULE | Freq: Every day | ORAL | Status: DC

## 2019-09-18 MED ORDER — BENZOCAINE-MENTHOL 20-0.5 % EX AERO: 1.0000 "application " | INHALATION_SPRAY | CUTANEOUS | Status: DC | PRN

## 2019-09-18 MED ORDER — NIFEDIPINE ER 30 MG PO TB24: 30.0000 mg | ORAL_TABLET | Freq: Every day | ORAL | 1 refills | Status: DC

## 2019-09-18 MED ORDER — ACETAMINOPHEN 500 MG PO TABS: 1000.0000 mg | ORAL_TABLET | Freq: Four times a day (QID) | ORAL | 2 refills | Status: AC | PRN

## 2019-09-18 MED ORDER — DOCUSATE SODIUM 100 MG PO CAPS: 100.0000 mg | ORAL_CAPSULE | Freq: Every day | ORAL | 0 refills | Status: DC

## 2019-09-18 MED ORDER — TRAMADOL HCL 50 MG PO TABS: 50.0000 mg | ORAL_TABLET | Freq: Four times a day (QID) | ORAL | 0 refills | Status: AC | PRN

## 2019-09-18 NOTE — Discharge Summary (Addendum)
OB Discharge Summary  Patient Name: Taylor Fitzpatrick DOB: 1981/02/26 MRN: [id number]  Date of admission: 09/16/2019 Delivering provider: Tiana Loft E   Date of discharge: 09/18/2019  Admitting diagnosis: pregnancy Intrauterine pregnancy: [redacted]w[redacted]d     Secondary diagnosis:Principal Problem:   Postpartum care following vaginal delivery 11/11 Active Problems:   Encounter for planned induction of labor   SVD (spontaneous vaginal delivery)    Third degree perineal laceration - third degree, right labial; perineal laceration    Maternal anemia, with delivery   Vaginal hematoma   Postpartum hypertension  Additional problems:none     Discharge diagnosis:  Patient Active Problem List   Diagnosis Date Noted  . Postpartum hypertension 09/18/2019  . SVD (spontaneous vaginal delivery)  09/17/2019  . Postpartum care following vaginal delivery 11/11 09/17/2019  . Third degree perineal laceration - third degree, right labial; perineal laceration  09/17/2019  . Maternal anemia, with delivery 09/17/2019  . Vaginal hematoma 09/17/2019  . Encounter for planned induction of labor 09/16/2019                                                                Post partum procedures: blood transfusion 2 units and parenteral iron  Augmentation: AROM, Pitocin, Cytotec and Foley Balloon Pain control: Stadol Laceration:3rd degree  Episiotomy:None  Complications: [id number], vaginal wall hematoma   Hospital course:  Induction of Labor With Vaginal Delivery   38 y.o. yo G1P1001 at [redacted]w[redacted]d was admitted to the hospital 09/16/2019 for induction of labor.  Indication for induction: ter, large for gestational age fetus..  Patient had an complicated labor course as follows: Vaginal cytotec x 2 doses, then oral cytotec x 1 dose and cervical balloon, AROM. Membrane Rupture Time/Date: 2:18 PM ,09/16/2019   Intrapartum Procedures: Episiotomy: None [1]                                         Lacerations:   3rd degree [4]  Patient had delivery of a Viable infant.  Information for the patient's newborn:  Keila, Turan [[id number]]  Delivery Method: Vaginal, Spontaneous(Filed from Delivery Summary)    09/16/2019  Details of delivery can be found in separate delivery note.  Patient had a postpartum course complicated by severe hemorrhage from vaginal hematoma which was evacuated under general anesthesia several hours after delivery. Patient received 2 units of PRBCs and iron dextran parenteral 1000 mg the day of discharge. Hemoglobin levels on admit was 11.4, dropped to nadir of 7.2 on PPD #1, then back up at 8 after blood transfusion. Her blood pressures were labile in mild range during labor and postpartum, preeclamptic labs remained stable, no neural symptoms of preeclampsia noted. She was started on Procardia 30XL on PPD #1. Patient is discharged home 09/18/19. Close follow up in office in 1 week, and preeclampsia precautions reinforced prior to discharge. Plan of care in consult with Dr. Murrell Redden.  Physical exam  Vitals:   09/17/19 1330 09/17/19 2312 09/18/19 0507 09/18/19 0828  BP: (!) 142/72 (!) 154/83 (!) 149/76 133/80  Pulse: 97 (!) 105 76 71  Resp: 18 18 18 16   Temp: 99.3 F (37.4 C) 98.4 F (36.9 C) 98.6 F (37  C)   TempSrc: Oral Oral Oral   SpO2: 96% 98% 98%   Weight:      Height:       General: alert, cooperative and no distress, pale Lochia: modertae Uterine Fundus: firm Perineum: Healing well with no significant drainage, No significant erythema DVT Evaluation: No cords or calf tenderness. No significant calf/ankle edema. Labs: CBC Latest Ref Rng & Units 09/17/2019 09/17/2019 09/16/2019  WBC 4.0 - 10.5 K/uL 12.5(H) 12.8(H) 20.4(H)  Hemoglobin 12.0 - 15.0 g/dL 8.0(L) 7.2(L) 9.3(L)  Hematocrit 36.0 - 46.0 % 23.7(L) 21.5(L) 28.1(L)  Platelets 150 - 400 K/uL 228 244 307   CMP Latest Ref Rng & Units 09/18/2019 09/16/2019  Glucose 70 - 99 mg/dL 86 96  BUN 6 - 20 mg/dL 8  10  Creatinine 1.320.44 - 1.00 mg/dL 4.400.58 1.020.73  Sodium 725135 - 145 mmol/L 139 136  Potassium 3.5 - 5.1 mmol/L 3.4(L) 3.4(L)  Chloride 98 - 111 mmol/L 106 105  CO2 22 - 32 mmol/L 24 21(L)  Calcium 8.9 - 10.3 mg/dL 8.0(L) 9.1  Total Protein 6.5 - 8.1 g/dL 4.7(L) 6.0(L)  Total Bilirubin 0.3 - 1.2 mg/dL 0.3 0.4  Alkaline Phos 38 - 126 U/L 85 140(H)  AST 15 - 41 U/L 21 21  ALT 0 - 44 U/L 14 15   Vaccines: TDaP UTD         Flu    UTD  Discharge instruction: per After Visit Summary and "Baby and Me Booklet".  After Visit Meds:  Allergies as of 09/18/2019      Reactions   Bactrim [sulfamethoxazole-trimethoprim]       Medication List    STOP taking these medications   folic acid 800 MCG tablet Commonly known as: FOLVITE     TAKE these medications   acetaminophen 500 MG tablet Commonly known as: TYLENOL Take 2 tablets (1,000 mg total) by mouth every 6 (six) hours as needed. What changed:   how much to take  reasons to take this   benzocaine-Menthol 20-0.5 % Aero Commonly known as: DERMOPLAST Apply 1 application topically as needed for irritation (perineal discomfort).   calcium carbonate 500 MG chewable tablet Commonly known as: TUMS - dosed in mg elemental calcium Chew 2 tablets by mouth as needed for indigestion or heartburn.   coconut oil Oil Apply 1 application topically as needed.   docusate sodium 100 MG capsule Commonly known as: COLACE Take 1 capsule (100 mg total) by mouth daily.   famotidine 10 MG tablet Commonly known as: PEPCID Take 10 mg by mouth 2 (two) times daily as needed for heartburn or indigestion.   ibuprofen 600 MG tablet Commonly known as: ADVIL Take 1 tablet (600 mg total) by mouth every 6 (six) hours.   iron polysaccharides 150 MG capsule Commonly known as: Ferrex 150 Take 1 capsule (150 mg total) by mouth daily.   NIFEdipine 30 MG 24 hr tablet Commonly known as: ADALAT CC Take 1 tablet (30 mg total) by mouth daily.   prenatal  multivitamin Tabs tablet Take 1 tablet by mouth daily at 12 noon.   traMADol 50 MG tablet Commonly known as: Ultram Take 1 tablet (50 mg total) by mouth every 6 (six) hours as needed for moderate pain. Take for breakthrough pain after using Tylenol and ibuprofen.            Discharge Care Instructions  (From admission, onward)         Start     Ordered  09/18/19 0000  Discharge wound care:    Comments: Sitz baths 2 times /day with warm water x 1 week. May add herbals: 1 ounce dried comfrey leaf* 1 ounce calendula flowers 1 ounce lavender flowers 1/2 ounce dried uva ursi leaves 1/2 ounce witch hazel blossoms (if you can find them) 1/2 ounce dried sage leaf 1/2 cup sea salt Directions: Bring 2 quarts of water to a boil. Turn off heat, and place 1 ounce (approximately 1 large handful) of the above mixed herbs (not the salt) into the pot. Steep, covered, for 30 minutes.  Strain the liquid well with a fine mesh strainer, and discard the herb material. Add 2 quarts of liquid to the tub, along with the 1/2 cup of salt. This medicinal liquid can also be made into compresses and peri-rinses.   09/18/19 1018          Diet: routine diet  Activity: Advance as tolerated. Pelvic rest for 6 weeks.   Postpartum contraception: Not Discussed  Newborn Data: Live born female  Birth Weight: 8 lb 6.9 oz (3824 g) APGAR: 9, 9  Newborn Delivery   Birth date/time: 09/16/2019 17:04:00 Delivery type: Vaginal, Spontaneous      named Earlene Plater Baby Feeding: Bottle Disposition:home with mother   Delivery Report:  Review the Delivery Report for details.    Follow up: Follow-up Information    Amado Nash Candace Gallus, MD. Schedule an appointment as soon as possible for a visit in 1 week(s).   Specialty: Obstetrics and Gynecology Contact information: [address] [place] [address] [phone]             Signed: Cipriano Mile, MSN 09/18/2019, 10:19 AM

## 2019-09-18 NOTE — Lactation Note (Signed)
This note was copied from a baby's chart. Lactation Consultation Note  Patient Name: Taylor Fitzpatrick [id number] Date: 09/18/2019 Reason for consult: Follow-up assessment;Term;Infant weight loss Baby is 71 hours old .  As LC entered the room baby latched with firm support and depth.  Swallows noted and increased with compressions, per mom comfortable.  Mom presently receiving a Iron infusion and per RN probably will being D/C later today.  Due to several staff being in the room , [place] mentioned to mom the [facility] will F/U.   Maternal Data    Feeding Feeding Type: (baby already latched per dad 32 start time)  LATCH Score Latch: (latched with firm support and depth)  Audible Swallowing: (increased swallows with compressions)  Type of Nipple: Everted at rest and after stimulation  Comfort (Breast/Nipple): (per mom comfortable)  Hold (Positioning): Assistance needed to correctly position infant at breast and maintain latch.  LATCH Score: 7  Interventions Interventions: Breast feeding basics reviewed;Support pillows  Lactation Tools Discussed/Used     Consult Status Consult Status: Follow-up Date: 09/18/19 Follow-up type: In-patient    [place] 09/18/2019, 11:13 AM

## 2019-09-18 NOTE — Lactation Note (Signed)
This note was copied from a baby's chart. Lactation Consultation Note  Patient Name: Taylor Fitzpatrick [id number] Date: 09/18/2019 Reason for consult: Follow-up assessment;Primapara;1st time breastfeeding;Term;Infant weight loss  Baby 72 hours old , 2nd [place] visit and baby latched and per dad has been feeding Since 2:10 pm and LC noted swallows.  LC reviewed sore nipples and engorgement prevention and tx.  Per mom has a hand pump and a DEBP ( Medela ) at home.  LC discussed the importance of STS feedings.  Discussed nutritive vs non - nutritive sucking patterns and to watch the baby For hanging out latched. LC stressed the importance of releasing the suction and seeing if the baby is still hungry and offer the 2nd breast.  Mom has the [facility] pamphlet with phone numbers.     Maternal Data Has patient been taught Hand Expression?: Yes  Feeding Feeding Type: (baby feeding)  LATCH Score Latch: (latched with firm support and depth)  Audible Swallowing: (increased swallows with compressions)     Comfort (Breast/Nipple): (per mom comfortable)        Interventions Interventions: Breast feeding basics reviewed  Lactation Tools Discussed/Used Pump Review: Milk Storage   Consult Status Consult Status: Complete Date: 09/18/19 Follow-up type: In-patient    [place] 09/18/2019, 3:02 PM

## 2019-09-24 ENCOUNTER — Other Ambulatory Visit: Payer: Self-pay | Admitting: Obstetrics and Gynecology

## 2019-09-24 DIAGNOSIS — R2231 Localized swelling, mass and lump, right upper limb: Secondary | ICD-10-CM

## 2019-10-05 ENCOUNTER — Ambulatory Visit
Admission: RE | Admit: 2019-10-05 | Discharge: 2019-10-05 | Disposition: A | Discharge status: Home / Self Care | Payer: POS | Source: Ambulatory Visit | Attending: Obstetrics and Gynecology | Admitting: Obstetrics and Gynecology

## 2019-10-05 ENCOUNTER — Other Ambulatory Visit: Payer: Self-pay

## 2019-10-05 DIAGNOSIS — R2231 Localized swelling, mass and lump, right upper limb: Secondary | ICD-10-CM

## 2020-12-31 IMAGING — MG DIGITAL DIAGNOSTIC BILAT W/ TOMO W/ CAD
6 of 10 series · 6 of 30 positions shown · non-contrast
Comparison: Previous exam(s).

CLINICAL DATA: Breastfeeding patient with a lump in the right
axilla.

EXAM:
DIGITAL DIAGNOSTIC RIGHT MAMMOGRAM WITH CAD AND TOMO
ULTRASOUND RIGHT BREAST

[R MLO synth-2D]
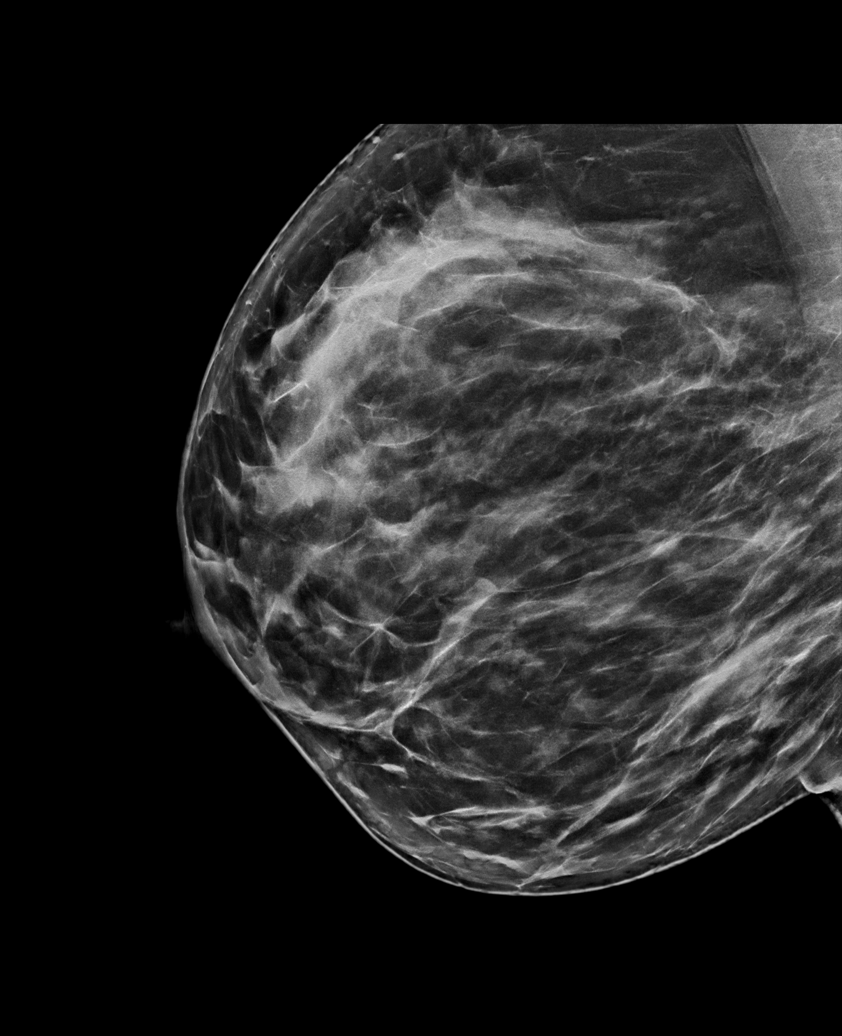

[R CC synth-2D]
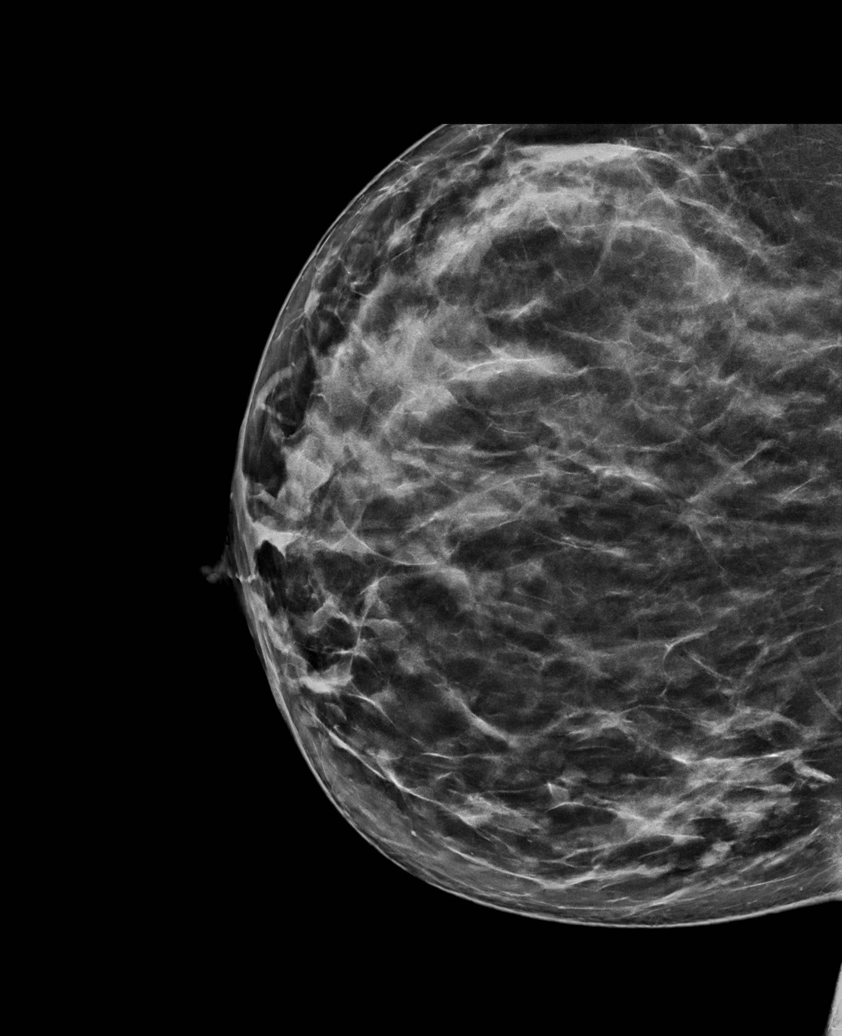

[R TAN synth-2D]
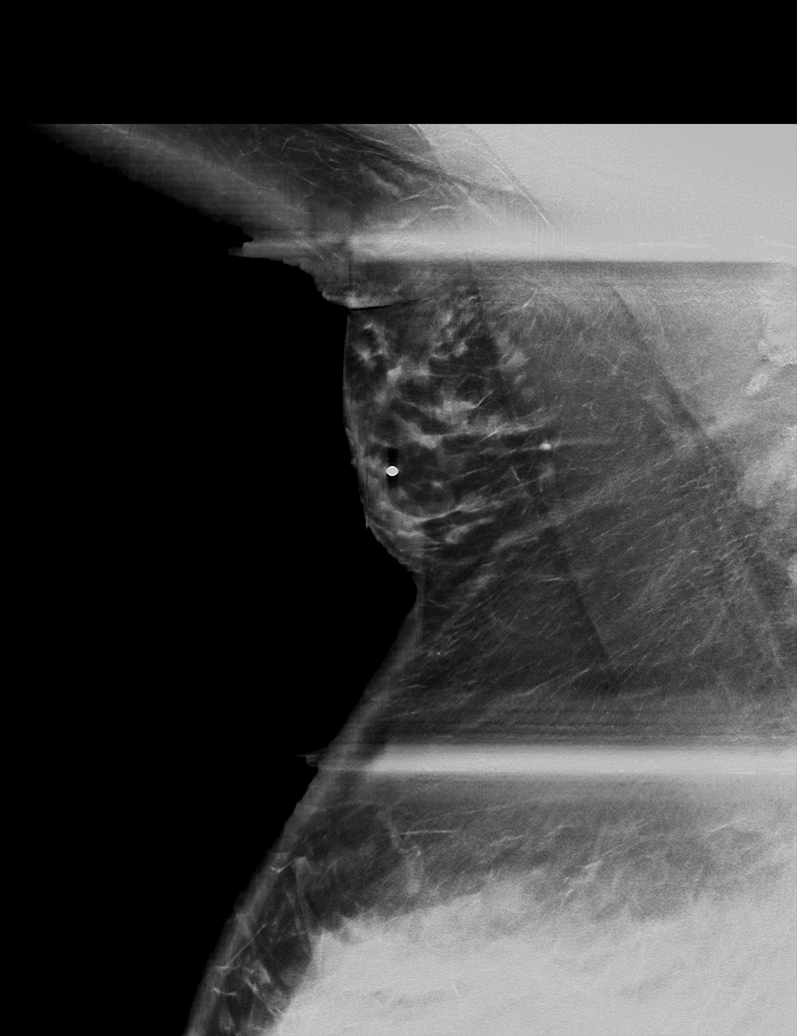

[L CC synth-2D]
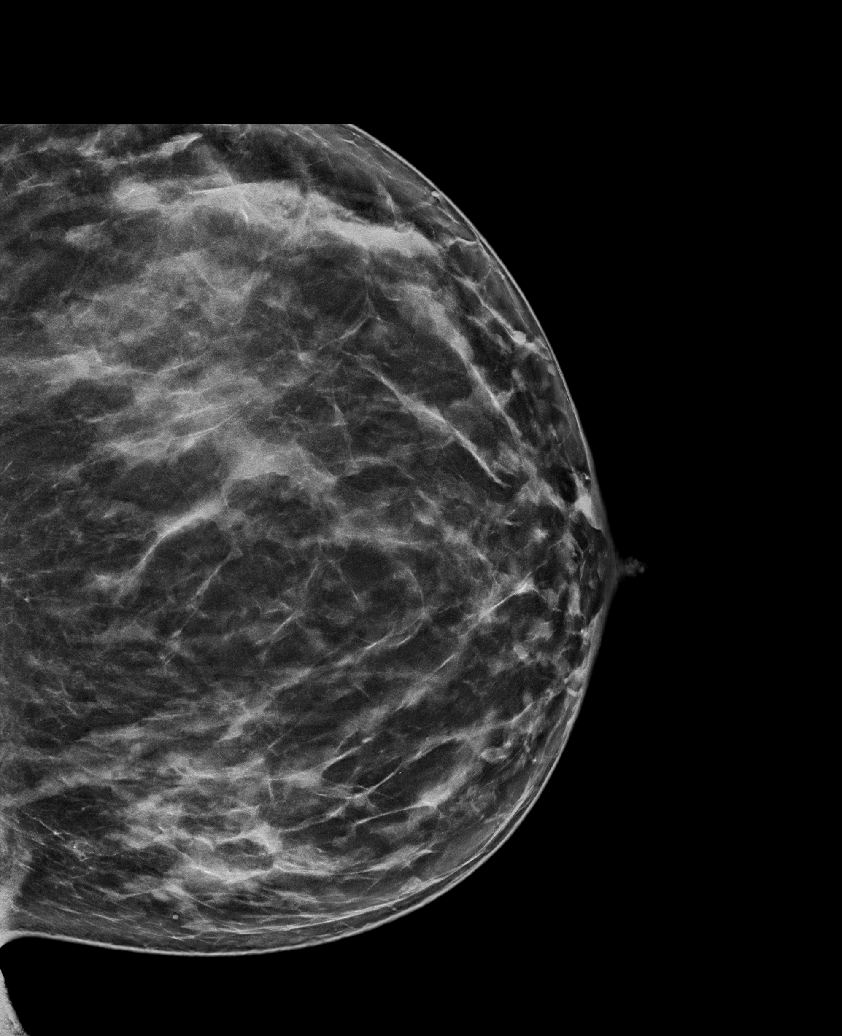

[L MLO synth-2D]
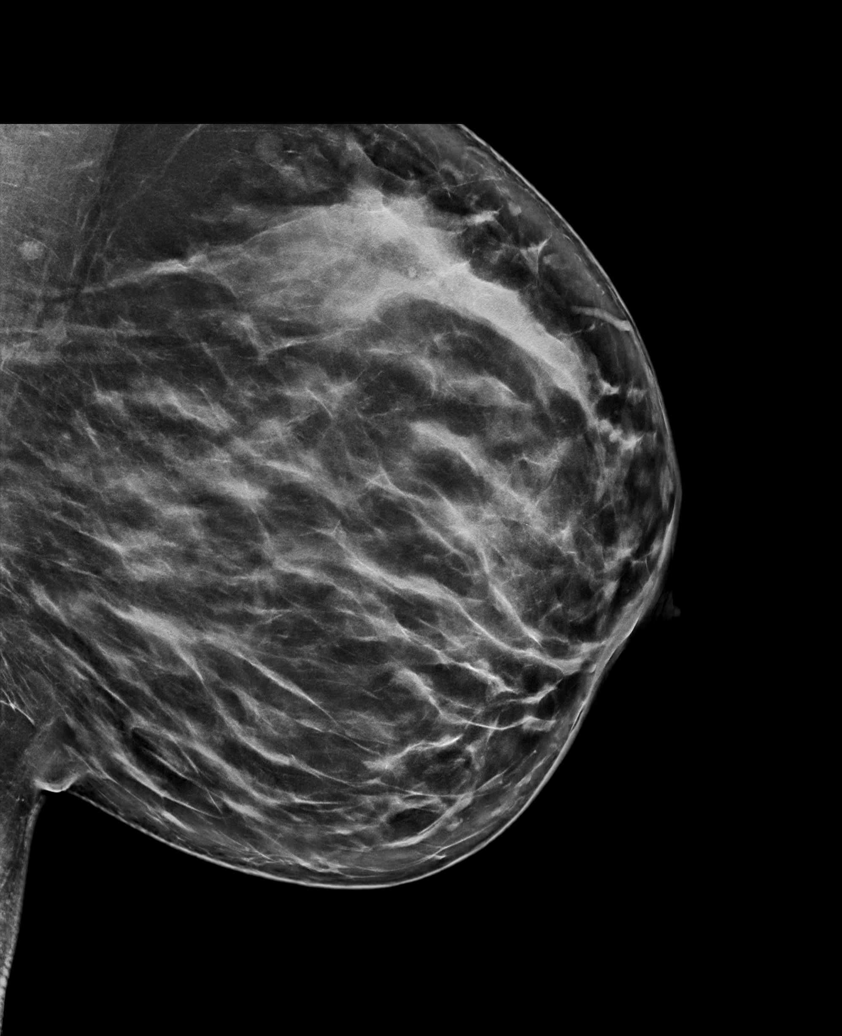

[L CC tomo · tomo slice 46/91.0]
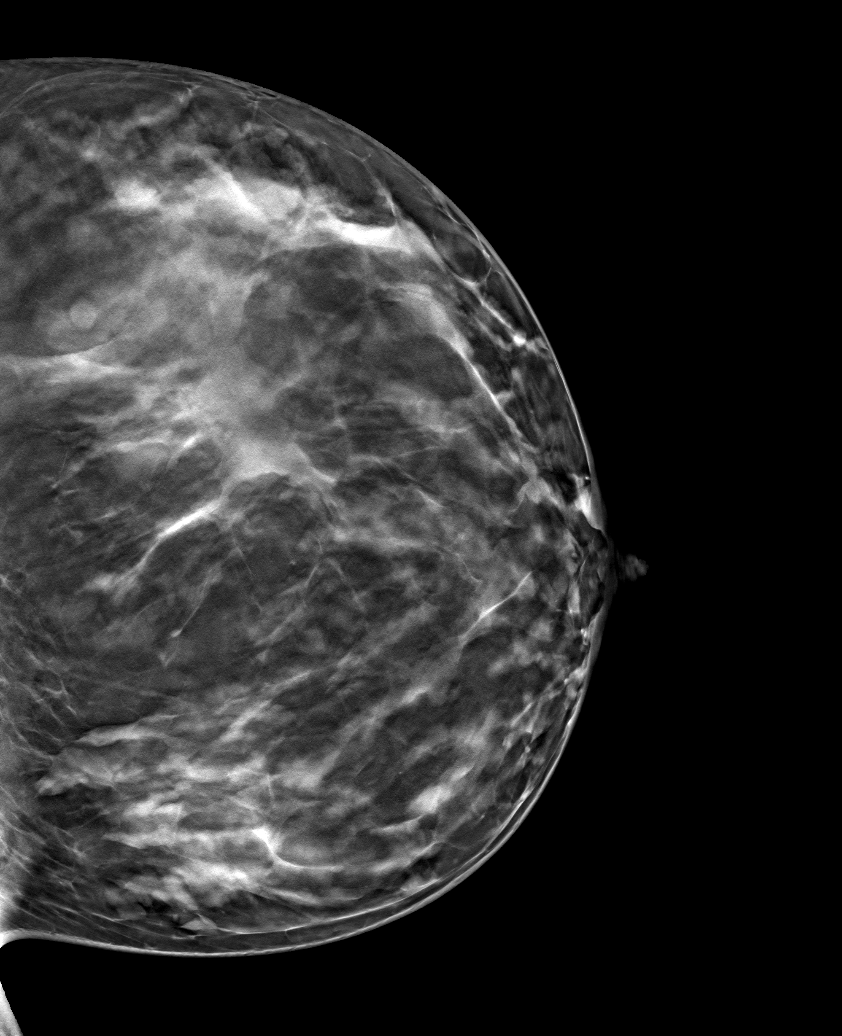

[6 of 30 positions shown; findings below may reference images not displayed]

ACR Breast Density Category c: The breast tissue is heterogeneously
dense, which may obscure small masses.
FINDINGS: Axillary tissue is seen in the region of the patient's right lump.
No suspicious masses, calcifications, or distortion are seen in
either breast.

Mammographic images were processed with CAD.

On physical exam, no suspicious lumps are identified.

Targeted ultrasound is performed, showing an island of glandular
tissue in the right axilla containing a cyst, correlating with the
patient's symptoms.
IMPRESSION: The patient is feeling accessory tissue in the right axilla. No
evidence of malignancy in either breast.

RECOMMENDATION:
Recommend annual screening mammography.

I have discussed the findings and recommendations with the patient.
If applicable, a reminder letter will be sent to the patient
regarding the next appointment.

BI-RADS CATEGORY  2: Benign.

## 2021-11-05 NOTE — L&D Delivery Note (Signed)
   Delivery Note:   G2P1001 at [redacted]w[redacted]d  Admitting diagnosis: Normal labor [O80, Z37.9] Risks: PEC, AMA Onset of labor: 07/20/2022 at 1127 IOL/Augmentation: Cytotec ROM: 07/20/2022 at 1145, clear fluid  Complete dilation at 07/20/2022  1211 Onset of pushing at 1212 FHR second stage Cat I  CNM in room awaiting primary MD for delivery; Dr. Amado Nash en route  Analgesia/Anesthesia intrapartum:Local  Pushing in lithotomy position with CNM and L&D staff support. Husband, Taylor Fitzpatrick, present for birth and supportive.  Delivery of a Live born female  Birth Weight:  pending APGAR: 9, 10   Newborn Delivery   Birth date/time: 07/20/2022 12:14:00 Delivery type: Vaginal, Spontaneous     in cephalic presentation, position OA to ROA.  APGAR:1 min-9 , 5 min-10   Nuchal Cord: No  Cord double clamped after cessation of pulsation, cut by Taylor Fitzpatrick.  Collection of cord blood for typing completed. Arterial cord blood sample-No    Placenta delivered-Spontaneous  with 3 vessels . Uterotonics: IM Pitocin Placenta to L&D Uterine tone firm  Bleeding scant  2nd degree;Perineal  laceration identified.  Episiotomy:None  Local analgesia: Lidocaine  Repair: 2-0 in usual fashion with excellent hemostasis Est. Blood Loss (mL):163.00   Complications: None  Mom to postpartum.  Taylor Fitzpatrick to Couplet care / Skin to Skin.  Delivery Report:   Review the Delivery Report for details.    June Leap, CNM, MSN 07/20/2022, 12:43 PM

## 2021-11-13 LAB — OB RESULTS CONSOLE ANTIBODY SCREEN: Antibody Screen: NEGATIVE

## 2021-12-27 LAB — OB RESULTS CONSOLE RUBELLA ANTIBODY, IGM: Rubella: IMMUNE

## 2021-12-27 LAB — OB RESULTS CONSOLE RPR: RPR: NONREACTIVE

## 2021-12-27 LAB — OB RESULTS CONSOLE GC/CHLAMYDIA
Chlamydia: NEGATIVE
Neisseria Gonorrhea: NEGATIVE

## 2021-12-27 LAB — OB RESULTS CONSOLE HIV ANTIBODY (ROUTINE TESTING): HIV: NONREACTIVE

## 2021-12-27 LAB — OB RESULTS CONSOLE HEPATITIS B SURFACE ANTIGEN: Hepatitis B Surface Ag: NEGATIVE

## 2022-04-11 ENCOUNTER — Other Ambulatory Visit: Payer: Self-pay | Admitting: Obstetrics and Gynecology

## 2022-04-11 DIAGNOSIS — R2231 Localized swelling, mass and lump, right upper limb: Secondary | ICD-10-CM

## 2022-04-24 ENCOUNTER — Other Ambulatory Visit: Payer: Self-pay | Admitting: Obstetrics and Gynecology

## 2022-04-24 ENCOUNTER — Ambulatory Visit
Admission: RE | Admit: 2022-04-24 | Discharge: 2022-04-24 | Disposition: A | Discharge status: Home / Self Care | Payer: POS | Source: Ambulatory Visit | Attending: Obstetrics and Gynecology | Admitting: Obstetrics and Gynecology

## 2022-04-24 ENCOUNTER — Other Ambulatory Visit: Payer: Self-pay | Admitting: Surgery

## 2022-04-24 DIAGNOSIS — R2231 Localized swelling, mass and lump, right upper limb: Secondary | ICD-10-CM

## 2022-06-21 LAB — OB RESULTS CONSOLE GBS: GBS: NEGATIVE

## 2022-07-19 ENCOUNTER — Encounter (HOSPITAL_COMMUNITY): Payer: Self-pay | Admitting: *Deleted

## 2022-07-19 ENCOUNTER — Encounter (HOSPITAL_COMMUNITY): Payer: Self-pay | Admitting: Obstetrics and Gynecology

## 2022-07-19 ENCOUNTER — Telehealth (HOSPITAL_COMMUNITY): Payer: Self-pay | Admitting: *Deleted

## 2022-07-19 ENCOUNTER — Inpatient Hospital Stay (HOSPITAL_COMMUNITY)
Admission: AD | Admit: 2022-07-19 | Discharge: 2022-07-22 | DRG: 807 | Disposition: A | Discharge status: Home / Self Care | Payer: POS | Attending: Obstetrics and Gynecology | Admitting: Obstetrics and Gynecology

## 2022-07-19 DIAGNOSIS — Z23 Encounter for immunization: Secondary | ICD-10-CM | POA: Diagnosis not present

## 2022-07-19 DIAGNOSIS — Z3A39 39 weeks gestation of pregnancy: Secondary | ICD-10-CM | POA: Diagnosis not present

## 2022-07-19 DIAGNOSIS — O1404 Mild to moderate pre-eclampsia, complicating childbirth: Principal | ICD-10-CM | POA: Diagnosis present

## 2022-07-19 DIAGNOSIS — R030 Elevated blood-pressure reading, without diagnosis of hypertension: Secondary | ICD-10-CM | POA: Diagnosis present

## 2022-07-19 DIAGNOSIS — O9902 Anemia complicating childbirth: Secondary | ICD-10-CM | POA: Diagnosis present

## 2022-07-19 DIAGNOSIS — O139 Gestational [pregnancy-induced] hypertension without significant proteinuria, unspecified trimester: Secondary | ICD-10-CM | POA: Diagnosis present

## 2022-07-19 LAB — COMPREHENSIVE METABOLIC PANEL
ALT: 5 U/L (ref 0–44)
AST: 17 U/L (ref 15–41)
Albumin: 2.7 g/dL — ABNORMAL LOW (ref 3.5–5.0)
Alkaline Phosphatase: 117 U/L (ref 38–126)
Anion gap: 11 (ref 5–15)
BUN: 10 mg/dL (ref 6–20)
CO2: 18 mmol/L — ABNORMAL LOW (ref 22–32)
Calcium: 10 mg/dL (ref 8.9–10.3)
Chloride: 105 mmol/L (ref 98–111)
Creatinine, Ser: 0.64 mg/dL (ref 0.44–1.00)
GFR, Estimated: >60 mL/min (ref >60)
Glucose, Bld: 80 mg/dL (ref 70–99)
Potassium: 3.4 mmol/L — ABNORMAL LOW (ref 3.5–5.1)
Sodium: 134 mmol/L — ABNORMAL LOW (ref 135–145)
Total Bilirubin: 0.4 mg/dL (ref 0.3–1.2)
Total Protein: 6.2 g/dL — ABNORMAL LOW (ref 6.5–8.1)

## 2022-07-19 LAB — URINALYSIS, ROUTINE W REFLEX MICROSCOPIC
Bilirubin Urine: NEGATIVE
Glucose, UA: NEGATIVE mg/dL
Hgb urine dipstick: NEGATIVE
Ketones, ur: 5 mg/dL — AB
Leukocytes,Ua: NEGATIVE
Nitrite: NEGATIVE
Protein, ur: NEGATIVE mg/dL
Specific Gravity, Urine: 1.01 (ref 1.005–1.030)
pH: 6 (ref 5.0–8.0)

## 2022-07-19 LAB — TYPE AND SCREEN
ABO/RH(D): O POS
Antibody Screen: NEGATIVE

## 2022-07-19 LAB — PROTEIN / CREATININE RATIO, URINE
Creatinine, Urine: 71 mg/dL
Protein Creatinine Ratio: 0.13 mg/mg{Cre} (ref 0.00–0.15)
Total Protein, Urine: 9 mg/dL

## 2022-07-19 LAB — CBC
HCT: 35.1 % — ABNORMAL LOW (ref 36.0–46.0)
Hemoglobin: 11.9 g/dL — ABNORMAL LOW (ref 12.0–15.0)
MCH: 29.8 pg (ref 26.0–34.0)
MCHC: 33.9 g/dL (ref 30.0–36.0)
MCV: 87.8 fL (ref 80.0–100.0)
Platelets: 309 10*3/uL (ref 150–400)
RBC: 4 MIL/uL (ref 3.87–5.11)
RDW: 13.2 % (ref 11.5–15.5)
WBC: 10.8 10*3/uL — ABNORMAL HIGH (ref 4.0–10.5)
nRBC: 0 % (ref 0.0–0.2)

## 2022-07-19 MED ORDER — LACTATED RINGERS IV SOLN: INTRAVENOUS | Status: DC

## 2022-07-19 MED ORDER — ONDANSETRON HCL 4 MG/2ML IJ SOLN: 4.0000 mg | Freq: Four times a day (QID) | INTRAMUSCULAR | Status: DC | PRN

## 2022-07-19 MED ORDER — LIDOCAINE HCL (PF) 1 % IJ SOLN
30.0000 mL | INTRAMUSCULAR | Status: AC | PRN
  Administered 2022-07-20: 30 mL via SUBCUTANEOUS
  Filled 2022-07-19: qty 30

## 2022-07-19 MED ORDER — LACTATED RINGERS IV SOLN: 500.0000 mL | INTRAVENOUS | Status: DC | PRN

## 2022-07-19 MED ORDER — OXYCODONE-ACETAMINOPHEN 5-325 MG PO TABS: 2.0000 | ORAL_TABLET | ORAL | Status: DC | PRN

## 2022-07-19 MED ORDER — TERBUTALINE SULFATE 1 MG/ML IJ SOLN: 0.2500 mg | Freq: Once | INTRAMUSCULAR | Status: DC | PRN

## 2022-07-19 MED ORDER — SOD CITRATE-CITRIC ACID 500-334 MG/5ML PO SOLN: 30.0000 mL | ORAL | Status: DC | PRN

## 2022-07-19 MED ORDER — MISOPROSTOL 50MCG HALF TABLET
50.0000 ug | ORAL_TABLET | ORAL | Status: DC | PRN
  Administered 2022-07-19 – 2022-07-20 (×3): 50 ug via BUCCAL
  Filled 2022-07-19 (×4): qty 1

## 2022-07-19 MED ORDER — ACETAMINOPHEN 325 MG PO TABS: 650.0000 mg | ORAL_TABLET | ORAL | Status: DC | PRN

## 2022-07-19 MED ORDER — OXYCODONE-ACETAMINOPHEN 5-325 MG PO TABS: 1.0000 | ORAL_TABLET | ORAL | Status: DC | PRN

## 2022-07-19 MED ORDER — FLEET ENEMA 7-19 GM/118ML RE ENEM: 1.0000 | ENEMA | RECTAL | Status: DC | PRN

## 2022-07-19 MED ORDER — OXYTOCIN-SODIUM CHLORIDE 30-0.9 UT/500ML-% IV SOLN
2.5000 [IU]/h | INTRAVENOUS | Status: DC
  Filled 2022-07-19: qty 500

## 2022-07-19 MED ORDER — OXYTOCIN BOLUS FROM INFUSION: 333.0000 mL | Freq: Once | INTRAVENOUS | Status: DC

## 2022-07-19 NOTE — Progress Notes (Signed)
S: Feeling some cramps. Husband, Taylor Fitzpatrick, present and supportive. Eagerly anticipating baby girl "Wyona".  O: Vitals:   07/19/22 2001 07/19/22 2030 07/19/22 2209 07/19/22 2304  BP: (!) 144/86 (!) 148/79  134/83  Pulse:  98  93  Resp:      Temp:   98.6 F (37 C)   TempSrc:   Oral   SpO2:  98%    Weight:      Height:       FHT:  FHR: 135 bpm, variability: moderate,  accelerations:  Present,  decelerations:  Absent UC:   regular, every 3-6 minutes SVE:   Dilation: 2 Effacement (%): 50 Exam by:: Dorisann Frames CNM  A / P: Induction of labor due to gestational hypertension  Fetal Wellbeing:  Category I PEC: Mild range BP, labs stable, denies PEC symptoms GBS: Negative Pain Control:  Labor support without medications Anticipated MOD:  NSVD  Plan cervical ripening overnight with buccal Cytotec q 4 hours.   Dr. Billy Coast updated on patient status and plan of care.   June Leap, CNM, MSN 07/19/2022, 11:09 PM

## 2022-07-19 NOTE — Telephone Encounter (Signed)
Preadmission screen  

## 2022-07-19 NOTE — MAU Note (Signed)
.  Taylor Fitzpatrick is a 41 y.o. at [redacted]w[redacted]d here in MAU reporting: evaluation for PreE,went to [facility] office appt yesterday and had lab work and had elevated protein, told to come in for evaluation. Pt reports ctx not constantly but more frequent. Pt denies PIH s/s, VB, LOF, DFM, abnormal discharge, and complications in the pregnancy GBS neg IOL Thurs   Pain score: 2/10 Vitals:   07/19/22 1937  BP: (!) 142/85  Pulse: (!) 106  Resp: 18  Temp: 98.8 F (37.1 C)  SpO2: 100%     FHT:145  Lab orders placed from triage:  UA

## 2022-07-20 ENCOUNTER — Encounter (HOSPITAL_COMMUNITY): Payer: Self-pay | Admitting: Anesthesiology

## 2022-07-20 ENCOUNTER — Other Ambulatory Visit: Payer: Self-pay

## 2022-07-20 ENCOUNTER — Encounter (HOSPITAL_COMMUNITY): Payer: Self-pay | Admitting: Obstetrics and Gynecology

## 2022-07-20 DIAGNOSIS — O139 Gestational [pregnancy-induced] hypertension without significant proteinuria, unspecified trimester: Secondary | ICD-10-CM | POA: Diagnosis present

## 2022-07-20 LAB — CBC
HCT: 35.2 % — ABNORMAL LOW (ref 36.0–46.0)
Hemoglobin: 12 g/dL (ref 12.0–15.0)
MCH: 29.6 pg (ref 26.0–34.0)
MCHC: 34.1 g/dL (ref 30.0–36.0)
MCV: 86.7 fL (ref 80.0–100.0)
Platelets: 283 10*3/uL (ref 150–400)
RBC: 4.06 MIL/uL (ref 3.87–5.11)
RDW: 13 % (ref 11.5–15.5)
WBC: 11.8 10*3/uL — ABNORMAL HIGH (ref 4.0–10.5)
nRBC: 0 % (ref 0.0–0.2)

## 2022-07-20 LAB — RPR: RPR Ser Ql: NONREACTIVE

## 2022-07-20 MED ORDER — INFLUENZA VAC SPLIT QUAD 0.5 ML IM SUSY
0.5000 mL | PREFILLED_SYRINGE | INTRAMUSCULAR | Status: AC
  Administered 2022-07-21: 0.5 mL via INTRAMUSCULAR
  Filled 2022-07-20: qty 0.5

## 2022-07-20 MED ORDER — DIPHENHYDRAMINE HCL 25 MG PO CAPS: 25.0000 mg | ORAL_CAPSULE | Freq: Four times a day (QID) | ORAL | Status: DC | PRN

## 2022-07-20 MED ORDER — COCONUT OIL OIL: 1.0000 | TOPICAL_OIL | Status: DC | PRN

## 2022-07-20 MED ORDER — ZOLPIDEM TARTRATE 5 MG PO TABS: 5.0000 mg | ORAL_TABLET | Freq: Every evening | ORAL | Status: DC | PRN

## 2022-07-20 MED ORDER — BENZOCAINE-MENTHOL 20-0.5 % EX AERO
1.0000 | INHALATION_SPRAY | CUTANEOUS | Status: DC | PRN
  Administered 2022-07-20: 1 via TOPICAL
  Filled 2022-07-20: qty 56

## 2022-07-20 MED ORDER — DIBUCAINE (PERIANAL) 1 % EX OINT: 1.0000 | TOPICAL_OINTMENT | CUTANEOUS | Status: DC | PRN

## 2022-07-20 MED ORDER — PRENATAL MULTIVITAMIN CH
1.0000 | ORAL_TABLET | Freq: Every day | ORAL | Status: DC
  Administered 2022-07-21: 1 via ORAL
  Filled 2022-07-20 (×2): qty 1

## 2022-07-20 MED ORDER — TETANUS-DIPHTH-ACELL PERTUSSIS 5-2.5-18.5 LF-MCG/0.5 IM SUSY: 0.5000 mL | PREFILLED_SYRINGE | Freq: Once | INTRAMUSCULAR | Status: DC

## 2022-07-20 MED ORDER — IBUPROFEN 600 MG PO TABS
600.0000 mg | ORAL_TABLET | Freq: Four times a day (QID) | ORAL | Status: DC
  Administered 2022-07-20 – 2022-07-21 (×5): 600 mg via ORAL
  Filled 2022-07-20 (×7): qty 1

## 2022-07-20 MED ORDER — DIPHENHYDRAMINE HCL 50 MG/ML IJ SOLN: 12.5000 mg | INTRAMUSCULAR | Status: DC | PRN

## 2022-07-20 MED ORDER — OXYTOCIN 10 UNIT/ML IJ SOLN: 10.0000 [IU] | Freq: Once | INTRAMUSCULAR | Status: DC

## 2022-07-20 MED ORDER — WITCH HAZEL-GLYCERIN EX PADS: 1.0000 | MEDICATED_PAD | CUTANEOUS | Status: DC | PRN

## 2022-07-20 MED ORDER — ONDANSETRON HCL 4 MG PO TABS: 4.0000 mg | ORAL_TABLET | ORAL | Status: DC | PRN

## 2022-07-20 MED ORDER — PHENYLEPHRINE 80 MCG/ML (10ML) SYRINGE FOR IV PUSH (FOR BLOOD PRESSURE SUPPORT)
80.0000 ug | PREFILLED_SYRINGE | INTRAVENOUS | Status: DC | PRN
  Filled 2022-07-20: qty 10

## 2022-07-20 MED ORDER — EPHEDRINE 5 MG/ML INJ: 10.0000 mg | INTRAVENOUS | Status: DC | PRN

## 2022-07-20 MED ORDER — FENTANYL-BUPIVACAINE-NACL 0.5-0.125-0.9 MG/250ML-% EP SOLN
12.0000 mL/h | EPIDURAL | Status: DC | PRN
  Filled 2022-07-20: qty 250

## 2022-07-20 MED ORDER — SENNOSIDES-DOCUSATE SODIUM 8.6-50 MG PO TABS
2.0000 | ORAL_TABLET | Freq: Every day | ORAL | Status: DC
  Administered 2022-07-21: 2 via ORAL
  Filled 2022-07-20 (×2): qty 2

## 2022-07-20 MED ORDER — LACTATED RINGERS IV SOLN: 500.0000 mL | Freq: Once | INTRAVENOUS | Status: DC

## 2022-07-20 MED ORDER — PHENYLEPHRINE 80 MCG/ML (10ML) SYRINGE FOR IV PUSH (FOR BLOOD PRESSURE SUPPORT): 80.0000 ug | PREFILLED_SYRINGE | INTRAVENOUS | Status: DC | PRN

## 2022-07-20 MED ORDER — ACETAMINOPHEN 325 MG PO TABS: 650.0000 mg | ORAL_TABLET | ORAL | Status: DC | PRN

## 2022-07-20 MED ORDER — ONDANSETRON HCL 4 MG/2ML IJ SOLN: 4.0000 mg | INTRAMUSCULAR | Status: DC | PRN

## 2022-07-20 MED ORDER — SIMETHICONE 80 MG PO CHEW: 80.0000 mg | CHEWABLE_TABLET | ORAL | Status: DC | PRN

## 2022-07-20 MED ORDER — OXYTOCIN 10 UNIT/ML IJ SOLN
INTRAMUSCULAR | Status: AC
  Administered 2022-07-20: 10 [IU]
  Filled 2022-07-20: qty 1

## 2022-07-20 NOTE — Anesthesia Preprocedure Evaluation (Deleted)
Anesthesia Evaluation    Airway        Dental   Pulmonary           Cardiovascular      Neuro/Psych    GI/Hepatic   Endo/Other    Renal/GU      Musculoskeletal   Abdominal   Peds  Hematology   Anesthesia Other Findings   Reproductive/Obstetrics                            Anesthesia Physical  Anesthesia Plan  ASA:   Anesthesia Plan:    Post-op Pain Management:    Induction:   PONV Risk Score and Plan:   Airway Management Planned:   Additional Equipment:   Intra-op Plan:   Post-operative Plan:   Informed Consent:   Plan Discussed with:   Anesthesia Plan Comments:        Anesthesia Quick Evaluation  

## 2022-07-20 NOTE — H&P (Signed)
Taylor Fitzpatrick is a 41 y.o. G2P1001 at [redacted]w[redacted]d gestation presents for IOL d/t new onset elevated bp in office and office pc ratio 0.33. She was seen in office yesterday and called when lab resulted and instructed to go to hospital for IOL. She has no h/a, vision changes, ruq pain  Antepartum course:  see above, ama >42 y/o  ; aga-efw 73% [facility] at [facility] since 10 wks.  See complete pre-natal records  History OB History     Gravida  2   Para  1   Term  1   Preterm      AB      Living  1      SAB      IAB      Ectopic      Multiple  0   Live Births  1          Past Medical History:  Diagnosis Date   Blood transfusion without reported diagnosis    large hematoma 3 units RBS and Iron infusions   GERD (gastroesophageal reflux disease)    Headache    History of ovarian cyst    Past Surgical History:  Procedure Laterality Date   HEMATOMA EVACUATION  09/16/2019   Procedure: EVACUATION HEMATOMA;  Surgeon: Vick Frees, MD;  Location: [facility] LD ORS;  Service: Gynecology;;   PERINEAL LACERATION REPAIR N/A 09/16/2019   Procedure: Exam Under Anesthesia;  Surgeon: Vick Frees, MD;  Location: [facility] LD ORS;  Service: Gynecology;  Laterality: N/A;   Family History: family history includes Atrial fibrillation in her father and paternal grandfather; Endometriosis in her mother and sister; Hyperlipidemia in her mother; Hypertension in her mother. Social History:  reports that she has never smoked. She has never used smokeless tobacco. She reports that she does not currently use alcohol. She reports that she does not use drugs.  ROS: See above otherwise negative  Prenatal labs:  ABO, Rh: --/--/O POS (09/14 2038) Antibody: NEG (09/14 2038) Rubella: Immune (02/22 0000) RPR: NON REACTIVE (09/14 2006)  HBsAg: Negative (02/22 0000)  HIV:Non-reactive (02/22 0000)  GBS: Negative/-- (08/17 0000)  1 hr Glucola: Normal Genetic screening: Normal Anatomy US:  Normal  Physical Exam:   Dilation: 3.5 Effacement (%): 70 Station: -3 Exam by:: Dorisann Frames CNM Blood pressure (!) 141/83, pulse 88, temperature 98.3 F (36.8 C), resp. rate 17, height 5\' 5"  (1.651 m), weight 112.5 kg, SpO2 98 %, currently breastfeeding. A&O x 3 HEENT: Normal Lungs: CTAB CV: RRR Abdominal: Soft and Non-tender Lower Extremities: Edematous, Non-tender; +1 edema    Labs:  CBC:  Lab Results  Component Value Date   WBC 10.8 (H) 07/19/2022   RBC 4.00 07/19/2022   HGB 11.9 (L) 07/19/2022   HCT 35.1 (L) 07/19/2022   MCV 87.8 07/19/2022   MCH 29.8 07/19/2022   MCHC 33.9 07/19/2022   RDW 13.2 07/19/2022   PLT 309 07/19/2022   CMP:  Lab Results  Component Value Date   NA 134 (L) 07/19/2022   K 3.4 (L) 07/19/2022   CL 105 07/19/2022   CO2 18 (L) 07/19/2022   GLUCOSE 80 07/19/2022   BUN 10 07/19/2022   CREATININE 0.64 07/19/2022   CALCIUM 10.0 07/19/2022   PROT 6.2 (L) 07/19/2022   AST 17 07/19/2022   ALT 5 07/19/2022   ALBUMIN 2.7 (L) 07/19/2022   ALKPHOS 117 07/19/2022   BILITOT 0.4 07/19/2022   GFRNONAA >60 07/19/2022   GFRAA >60 09/18/2019   ANIONGAP  11 07/19/2022   Urine: Lab Results  Component Value Date   COLORURINE YELLOW 07/19/2022   APPEARANCEUR CLEAR 07/19/2022   LABSPEC 1.010 07/19/2022   PHURINE 6.0 07/19/2022   GLUCOSEU NEGATIVE 07/19/2022   HGBUR NEGATIVE 07/19/2022   BILIRUBINUR NEGATIVE 07/19/2022   KETONESUR 5 (A) 07/19/2022   PROTEINUR NEGATIVE 07/19/2022   NITRITE NEGATIVE 07/19/2022   LEUKOCYTESUR NEGATIVE 07/19/2022   Pc ratio on admission: 0.1  Prenatal Transfer Tool  Maternal Diabetes: No Genetic Screening: Normal Maternal Ultrasounds/Referrals: Normal Fetal Ultrasounds or other Referrals:  None Maternal Substance Abuse:  No Significant Maternal Medications:  None Significant Maternal Lab Results: Group B Strep negative Number of Prenatal Visits:greater than 3 verified prenatal visits Other Comments:   None    Assessment/Plan:  41 y.o. G2P1001 at [redacted]w[redacted]d gestation   IOL -  s/p cytotec x3; srom then made quick change with precipitous delivery; delivery by Dorisann Frames, CNM Mild pre-eclampsia - elevated bp in office, pc ratio 0.33 though wnl today; follow bps closely and plan 1 wk bp  check Gbs neg Ama: nml nipt Rh pos RI  Vick Frees 07/20/2022, 9:11 AM

## 2022-07-20 NOTE — Lactation Note (Signed)
This note was copied from a baby's chart. Lactation Consultation Note  Patient Name: Girl Shir Bergman EPPIR'J Date: 07/20/2022 Reason for consult: Initial assessment;Term;Other (Comment);Breastfeeding assistance (AMA, DAT (+)) Age:42 hours  Visited with family of 4 hours old FT female, Ms. Nieman is a P2 and experienced breastfeeding. Assisted with hand expression (no colostrum noted yet) and latching, she was already feeding baby when entered the room but latch was somehow shallow, re-latched after baby started crying due to shots, and showed MOB the "sandwich" hold and how to keep baby's arms wrapped around the breast for a deeper latch. Baby Wyona nursed for 21 minutes total prior falling asleep at the breast (see LATCH score). Reviewed normal newborn behavior, cluster feeding, feeding cues, size of baby's stomach and anticipatory guidelines. Let parents know that MOB can start pumping anytime and start supplementing with her own breastmilk due to baby being DAT (+), they voiced understanding.  Maternal Data Has patient been taught Hand Expression?: Yes Does the patient have breastfeeding experience prior to this delivery?: Yes How long did the patient breastfeed?: 15 months  Feeding Mother's Current Feeding Choice: Breast Milk  LATCH Score Latch: Grasps breast easily, tongue down, lips flanged, rhythmical sucking.  Audible Swallowing: A few with stimulation (with compressions)  Type of Nipple: Everted at rest and after stimulation  Comfort (Breast/Nipple): Soft / non-tender  Hold (Positioning): Assistance needed to correctly position infant at breast and maintain latch.  LATCH Score: 8  Interventions Interventions: Breast feeding basics reviewed;Assisted with latch;Skin to skin;Breast massage;Hand express;Breast compression;Adjust position;Education;LC Services brochure  Plan of care Encouraged to put baby to breast 8-12 times/24 hours or sooner if feeding cues are  present Hand expression and breast massage were also encouraged prior latching MOB will let her RN know if she would like to start pumping at anytime during her hospital stay due to baby's DAT (+) status  FOB present and very supportive, he's a PA and works in Nurse, mental health. All questions and concerns answered, family to contact [facility] services PRN.  Discharge Pump: Personal (DEBP at home)  Consult Status Consult Status: Follow-up Date: 07/21/22 Follow-up type: In-patient   Amonte Brookover Venetia Constable 07/20/2022, 4:15 PM

## 2022-07-21 LAB — CBC
HCT: 29 % — ABNORMAL LOW (ref 36.0–46.0)
Hemoglobin: 9.8 g/dL — ABNORMAL LOW (ref 12.0–15.0)
MCH: 29.8 pg (ref 26.0–34.0)
MCHC: 33.8 g/dL (ref 30.0–36.0)
MCV: 88.1 fL (ref 80.0–100.0)
Platelets: 222 10*3/uL (ref 150–400)
RBC: 3.29 MIL/uL — ABNORMAL LOW (ref 3.87–5.11)
RDW: 13.2 % (ref 11.5–15.5)
WBC: 11 10*3/uL — ABNORMAL HIGH (ref 4.0–10.5)
nRBC: 0 % (ref 0.0–0.2)

## 2022-07-21 NOTE — Progress Notes (Signed)
No c/o; tol po, ambulating, voids w/o difficulty Nml lochia Breastfeeding No h/a, vision changes, ruq pain  Patient Vitals for the past 24 hrs:  BP Temp Temp src Pulse Resp SpO2  07/21/22 0520 109/67 98.1 F (36.7 C) Oral 81 18 99 %  07/20/22 2333 118/71 98.2 F (36.8 C) Oral 61 18 98 %  07/20/22 2051 (!) 140/82 98.2 F (36.8 C) Oral 88 18 99 %  07/20/22 1615 139/82 97.8 F (36.6 C) Oral 73 18 99 %  07/20/22 1438 (!) 134/93 98.5 F (36.9 C) Oral (!) 112 20 --  07/20/22 1330 129/78 -- -- 84 17 --  07/20/22 1300 (!) 152/86 -- -- 78 17 --  07/20/22 1230 (!) 158/83 -- -- 82 -- --   A&ox3 Rrr Ctab Abd: soft,nt,nd; +bs; fundus firm and below umb LE: n0 edema, nt bilat     Latest Ref Rng & Units 07/21/2022    4:07 AM 07/20/2022   11:52 AM 07/19/2022    8:06 PM  CBC  WBC 4.0 - 10.5 K/uL 11.0  11.8  10.8   Hemoglobin 12.0 - 15.0 g/dL 9.8  12.0  11.9   Hematocrit 36.0 - 46.0 % 29.0  35.2  35.1   Platelets 150 - 400 K/uL 222  283  309    A/p: ppd 1 s/p svd Doing well - contin care, plan d/c home tomorrow Mild pre-eclampsia - follow bps, plan 1 wk fu Anemia of pregnancy with abl - asymptomatic, plan iron qd Rh pos RI Gbs neg

## 2022-07-21 NOTE — Lactation Note (Signed)
This note was copied from a baby's chart. Lactation Consultation Note  Patient Name: Taylor Fitzpatrick Date: 07/21/2022 Reason for consult: Follow-up assessment;Term Age:42 hours  Infant was already at breast when I entered the room. Mom was comfortable with latch & infant was nursing very well. Multiple, frequent swallows were noted (verified by cervical auscultation, but also heard to the naked ear). Mom reported that her breasts were heavier today compared to yesterday.   Considering infant's weight loss of 6% at 17 hrs of life and report of very, very large stool with 1st bowel movement, Mom allowed me to see current dirty diaper. When changing diaper, incredibly large bowel movement with multiple expulsions of stool + a void. Infant is no longer passing meconium, but is passing soft black-green stool. After changing baby, infant had a medium-sized emesis of breast milk.   I shared these positive signs of milk transfer with Dr. Abelina Bachelor. I have no concerns about infant.   Mom has a Medela pump at home and says she has 3 different-sizes, but uses the larger-sized flanges. Based on Mom's nipple diameter, I recommended that she use size 21 flanges.    Maternal Data Does the patient have breastfeeding experience prior to this delivery?: Yes How long did the patient breastfeed?: 15 months   LATCH Score Latch: Grasps breast easily, tongue down, lips flanged, rhythmical sucking.  Audible Swallowing: Spontaneous and intermittent  Type of Nipple: Everted at rest and after stimulation  Comfort (Breast/Nipple): Soft / non-tender  Hold (Positioning): No assistance needed to correctly position infant at breast.  LATCH Score: 10    Interventions Interventions: Education;Adjust position  Discharge Pump: DEBP (Medela)  Consult Status Consult Status: Complete    Matthias Hughs [facility] 07/21/2022, 1:52 PM

## 2022-07-22 MED ORDER — IBUPROFEN 600 MG PO TABS: 600.0000 mg | ORAL_TABLET | Freq: Four times a day (QID) | ORAL | 0 refills | Status: AC

## 2022-07-22 NOTE — Discharge Summary (Signed)
Postpartum Discharge Summary  Date of Service updated     Patient Name: Taylor Fitzpatrick DOB: Jul 31, 1981 MRN: [id number]  Date of admission: 07/19/2022 Delivery date:07/20/2022  Delivering provider: Gavin Potters K  Date of discharge: 07/22/2022  Admitting diagnosis: Normal labor [O80, Z37.9] Intrauterine pregnancy: [redacted]w[redacted]d    Secondary diagnosis:  Principal Problem:   Postpartum care following vaginal delivery 9/15 Active Problems:   Normal labor   Gestational hypertension   SVD (spontaneous vaginal delivery)   Second degree perineal laceration  Additional problems: mild pre-eclampsia    Discharge diagnosis: Term Pregnancy Delivered and Preeclampsia (mild)                                              Post partum procedures: none Augmentation: AROM and Cytotec Complications: None  Hospital course: Induction of Labor With Vaginal Delivery   41y.o. yo G2P2002 at 319w3das admitted to the hospital 07/19/2022 for induction of labor.  Indication for induction: Preeclampsia.  Patient had an uncomplicated labor course as follows: Membrane Rupture Time/Date: 11:45 AM ,07/20/2022   Delivery Method:Vaginal, Spontaneous  Episiotomy: None  Lacerations:  2nd degree;Perineal  Details of delivery can be found in separate delivery note.  Patient had a routine postpartum course. Patient is discharged home 07/22/22.  Newborn Data: Birth date:07/20/2022  Birth time:12:14 PM  Gender:Female  Living status:Living  Apgars:9 ,10  Weight:3850 g   Magnesium Sulfate received: No BMZ received: No Rhophylac:No MMR:No T-DaP:Given prenatally  Transfusion:No  Physical exam  Vitals:   07/21/22 0520 07/21/22 1450 07/21/22 1946 07/22/22 0533  BP: 109/67 126/70 126/66 127/72  Pulse: 81 69  98  Resp: 18 16 18 16   Temp: 98.1 F (36.7 C) 98.4 F (36.9 C) 98.3 F (36.8 C) 97.8 F (36.6 C)  TempSrc: Oral Oral Oral Oral  SpO2: 99%     Weight:      Height:       Labs: Lab Results  Component  Value Date   WBC 11.0 (H) 07/21/2022   HGB 9.8 (L) 07/21/2022   HCT 29.0 (L) 07/21/2022   MCV 88.1 07/21/2022   PLT 222 07/21/2022      Latest Ref Rng & Units 07/19/2022    8:06 PM  CMP  Glucose 70 - 99 mg/dL 80   BUN 6 - 20 mg/dL 10   Creatinine 0.44 - 1.00 mg/dL 0.64   Sodium 135 - 145 mmol/L 134   Potassium 3.5 - 5.1 mmol/L 3.4   Chloride 98 - 111 mmol/L 105   CO2 22 - 32 mmol/L 18   Calcium 8.9 - 10.3 mg/dL 10.0   Total Protein 6.5 - 8.1 g/dL 6.2   Total Bilirubin 0.3 - 1.2 mg/dL 0.4   Alkaline Phos 38 - 126 U/L 117   AST 15 - 41 U/L 17   ALT 0 - 44 U/L 5    Edinburgh Score:    07/21/2022   12:01 PM  Edinburgh Postnatal Depression Scale Screening Tool  I have been able to laugh and see the funny side of things. 0  I have looked forward with enjoyment to things. 0  I have blamed myself unnecessarily when things went wrong. 1  I have been anxious or worried for no good reason. 1  I have felt scared or panicky for no good reason. 0  Things have been  getting on top of me. 1  I have been so unhappy that I have had difficulty sleeping. 0  I have felt sad or miserable. 0  I have been so unhappy that I have been crying. 0  The thought of harming myself has occurred to me. 0  Edinburgh Postnatal Depression Scale Total 3      After visit meds:     Discharge home in stable condition Infant Feeding: Breast Infant Disposition:home with mother Discharge instruction: per After Visit Summary and Postpartum booklet. Activity: Advance as tolerated. Pelvic rest for 6 weeks.  Diet: routine diet Anticipated Birth Control: Unsure Postpartum Appointment:6 weeks Additional Postpartum F/U: BP check 1 week Future Appointments:No future appointments. Follow up Visit:      07/22/2022 [name], MD

## 2022-07-22 NOTE — Progress Notes (Signed)
No c/o Nml lochia, voids w/o difficulty, pain controlled Breastfeeding  Patient Vitals for the past 24 hrs:  BP Temp Temp src Pulse Resp  07/22/22 0533 127/72 97.8 F (36.6 C) Oral 98 16  07/21/22 1946 126/66 98.3 F (36.8 C) Oral -- 18  07/21/22 1450 126/70 98.4 F (36.9 C) Oral 69 16   A&ox3 Nml respirations Abd: softn,nt,nd; fundus firm and below umb LE: trace edema, nt bilat     Latest Ref Rng & Units 07/21/2022    4:07 AM 07/20/2022   11:52 AM 07/19/2022    8:06 PM  CBC  WBC 4.0 - 10.5 K/uL 11.0  11.8  10.8   Hemoglobin 12.0 - 15.0 g/dL 9.8  12.0  11.9   Hematocrit 36.0 - 46.0 % 29.0  35.2  35.1   Platelets 150 - 400 K/uL 222  283  309    A/P: ppd 2 s/p svd Doing well - d/c home today Mild pre-ecampsia - nml bps after delivery; plan bp check in 1 wk Anemia of pregnancy with abl - asymptomatic, plan iron q day RH pos RI

## 2022-07-22 NOTE — Lactation Note (Signed)
This note was copied from a baby's chart. Lactation Consultation Note  Patient Name: Taylor Fitzpatrick [name] Date: 07/22/2022 Reason for consult: RN request;Term Age:42 hours  LC in to room prior to discharge. Birthing parent reports good feedings and deny any pain/discomfort. Discussed normal behavior and patterns after 24h, voids and stools as signs good intake, pumping, clusterfeeding, skin to skin. Talked about milk coming into volume and managing engorgement. Reinforced keeping baby awake during feedings to improve milk transfer. Encouraged to top up infant with expressed or pumped breast milk.  Reviewed local resources available after discharge.   Plan: 1-Aim for a deep, comfortable latch, breastfeeding on demand or 8-12 times in 24h period. 2-Hand express/pump as needed for topping up 3-Encouraged birthing parent rest, hydration and food intake.   Contact LC as needed for feeds/support/concerns/questions. All questions answered at this time. Reviewed [place] brochure.     Maternal Data Has patient been taught Hand Expression?: Yes Does the patient have breastfeeding experience prior to this delivery?: Yes  Feeding Mother's Current Feeding Choice: Breast Milk  Interventions Interventions: Expressed milk;Hand express;Breast massage;Education;Breast feeding basics reviewed  Discharge Discharge Education: Warning signs for feeding baby;Engorgement and breast care Pump: Personal WIC Program: No  Consult Status Consult Status: Complete Date: 07/22/22 Follow-up type: Call as needed    [place] 07/22/2022, 10:14 AM

## 2022-07-26 ENCOUNTER — Inpatient Hospital Stay (HOSPITAL_COMMUNITY): Payer: POS

## 2022-07-26 ENCOUNTER — Inpatient Hospital Stay (HOSPITAL_COMMUNITY): Admission: AD | Admit: 2022-07-26 | Payer: POS | Source: Home / Self Care | Admitting: Obstetrics and Gynecology

## 2022-07-28 ENCOUNTER — Telehealth (HOSPITAL_COMMUNITY): Payer: Self-pay

## 2022-07-28 NOTE — Telephone Encounter (Signed)
Patient did not answer phone call. Voicemail left for patient.   Sharyn Lull [facility] 07/28/2022,1446

## 2022-09-05 ENCOUNTER — Other Ambulatory Visit: Payer: Self-pay

## 2022-09-05 ENCOUNTER — Encounter: Payer: Self-pay | Admitting: Physical Therapy

## 2022-09-05 ENCOUNTER — Ambulatory Visit: Payer: POS | Attending: Obstetrics and Gynecology | Admitting: Physical Therapy

## 2022-09-05 DIAGNOSIS — M6281 Muscle weakness (generalized): Secondary | ICD-10-CM | POA: Diagnosis not present

## 2022-09-05 DIAGNOSIS — R279 Unspecified lack of coordination: Secondary | ICD-10-CM | POA: Insufficient documentation

## 2022-09-05 DIAGNOSIS — R293 Abnormal posture: Secondary | ICD-10-CM | POA: Insufficient documentation
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
## 2022-09-12 ENCOUNTER — Ambulatory Visit: Payer: POS | Admitting: Physical Therapy

## 2022-09-12 DIAGNOSIS — R279 Unspecified lack of coordination: Secondary | ICD-10-CM

## 2022-09-12 DIAGNOSIS — R293 Abnormal posture: Secondary | ICD-10-CM

## 2022-09-12 DIAGNOSIS — M6281 Muscle weakness (generalized): Secondary | ICD-10-CM
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
## 2022-09-19 ENCOUNTER — Encounter: Payer: Self-pay | Admitting: Physical Therapy

## 2022-09-19 ENCOUNTER — Ambulatory Visit: Payer: POS | Admitting: Physical Therapy

## 2022-09-19 DIAGNOSIS — R279 Unspecified lack of coordination: Secondary | ICD-10-CM

## 2022-09-19 DIAGNOSIS — M6281 Muscle weakness (generalized): Secondary | ICD-10-CM | POA: Diagnosis not present

## 2022-09-19 DIAGNOSIS — R293 Abnormal posture: Secondary | ICD-10-CM

## 2022-10-17 ENCOUNTER — Encounter: Payer: POS | Admitting: Physical Therapy

## 2022-10-24 ENCOUNTER — Encounter: Payer: POS | Admitting: Physical Therapy

## 2022-10-31 ENCOUNTER — Encounter: Payer: POS | Admitting: Physical Therapy

## 2023-07-21 IMAGING — US US AXILLARY RIGHT
1 series · 5 of 5 positions shown · non-contrast
Comparison: 10/05/2019 mammogram and ultrasound.

CLINICAL DATA: 41-year-old female with chronic intermittent pain
and swelling in the RIGHT axilla.

EXAM:
ULTRASOUND OF THE RIGHT AXILLA

[Series 2: us axillary right · 0.06mm/px · 5 acquisitions, 5 frames shown]
[im 1/5]
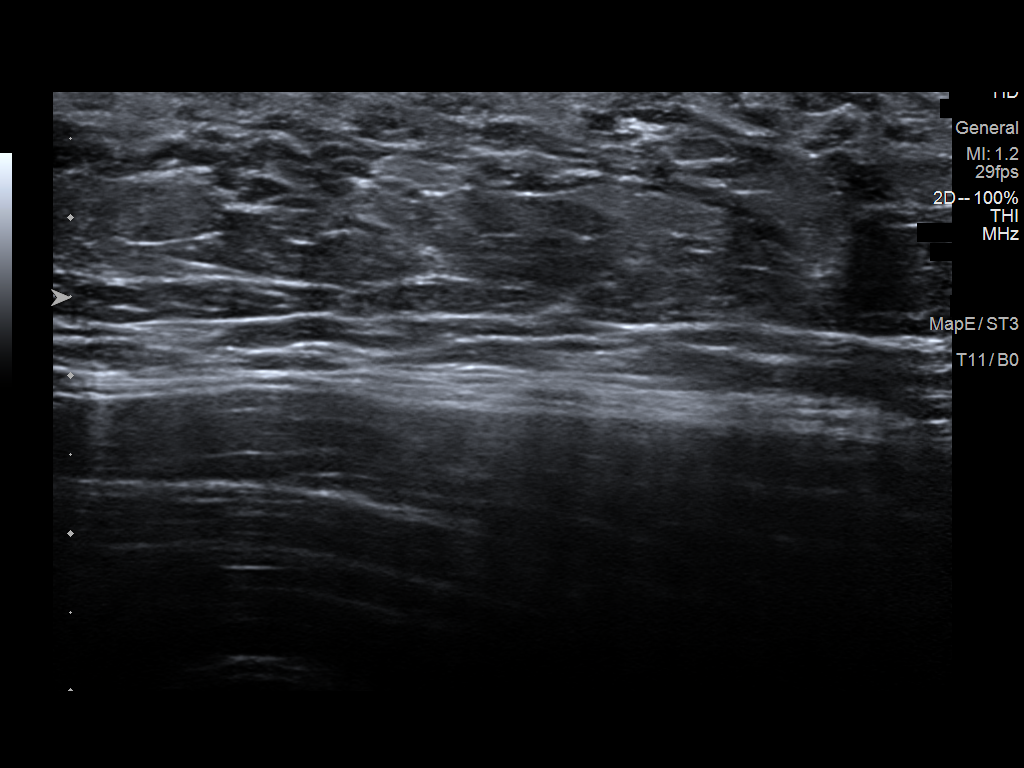
[im 2/5]
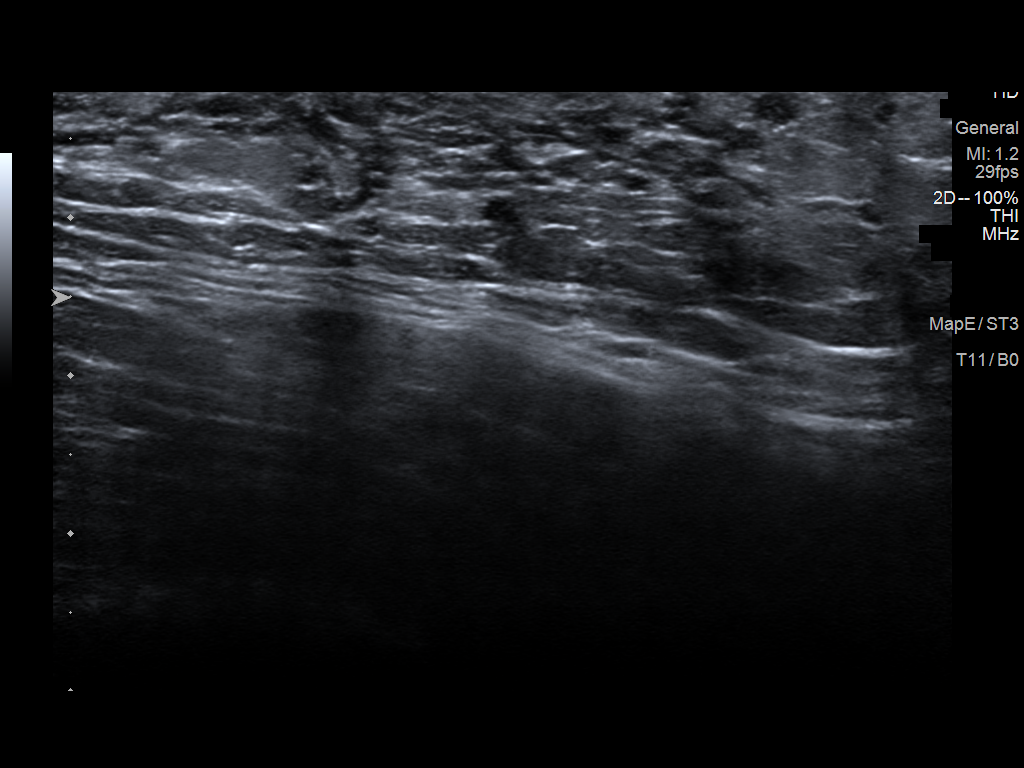
[im 3/5]
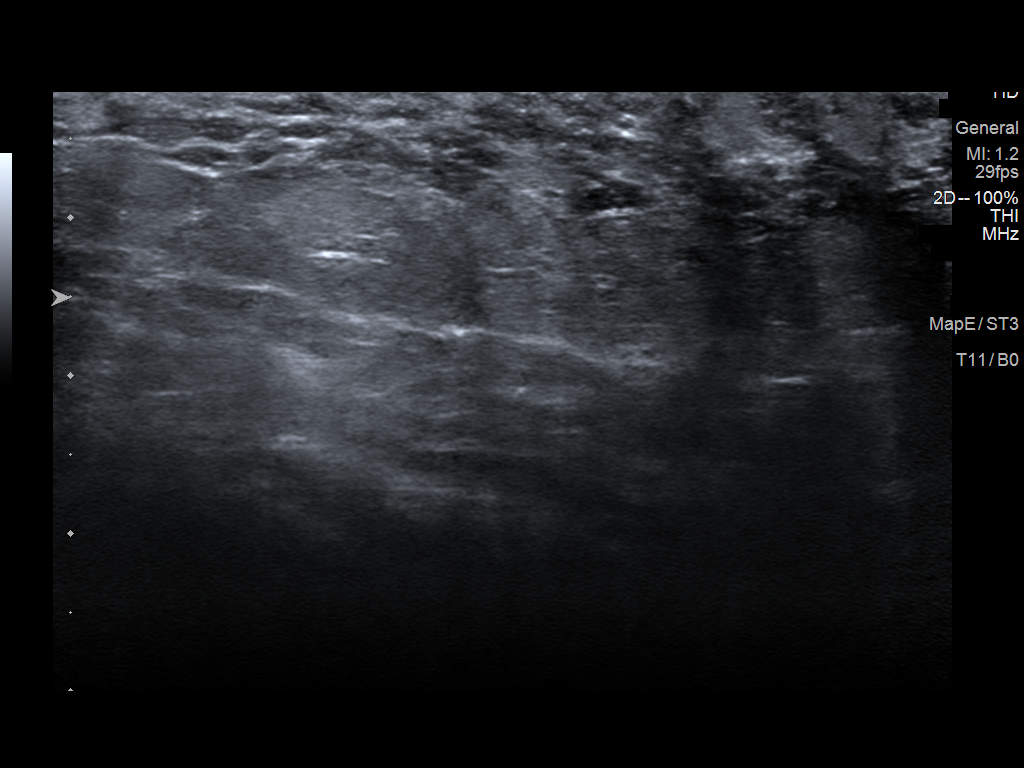
[im 4/5]
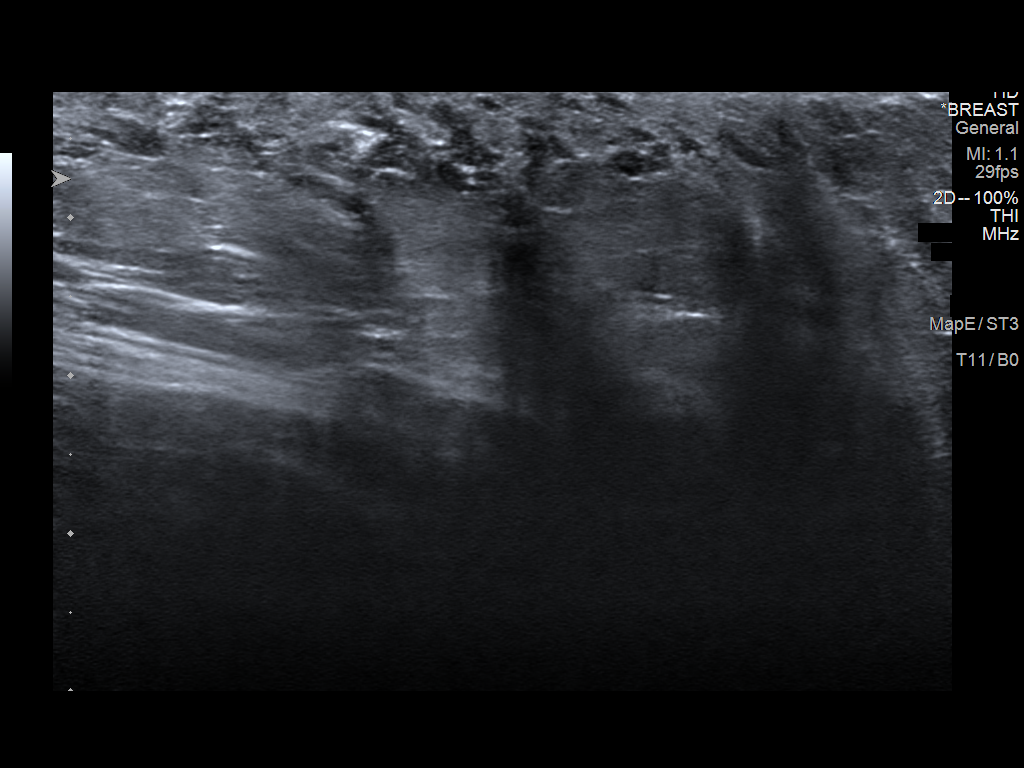
[im 5/5]
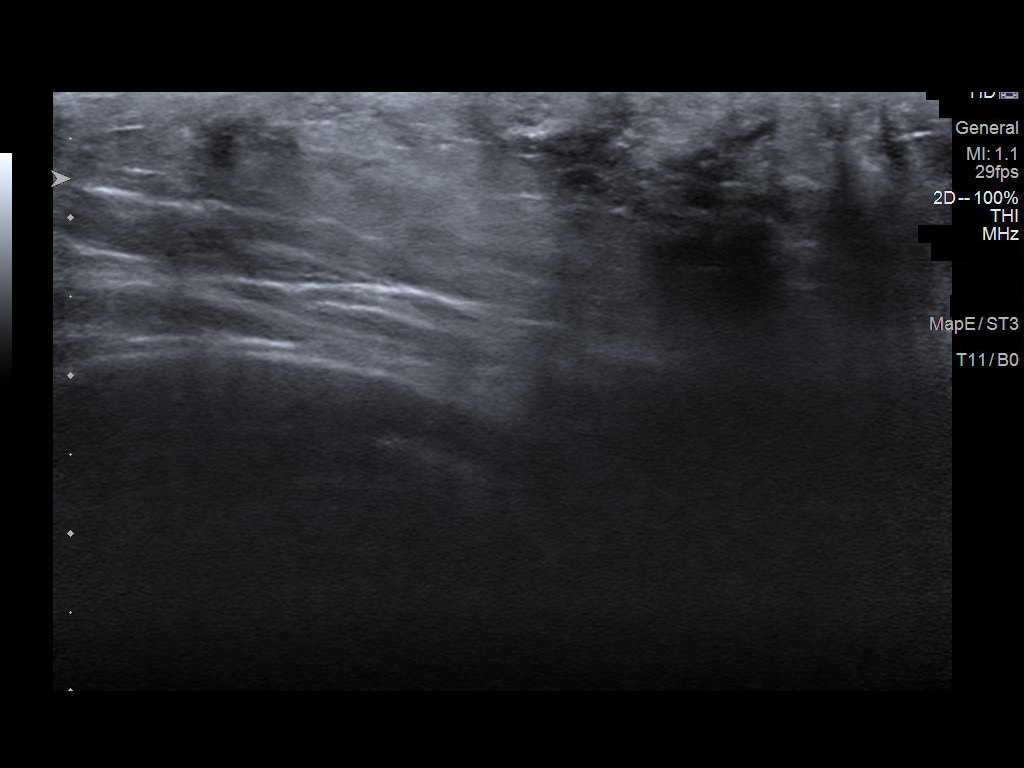

[5 of 5 positions shown; findings below may reference images not displayed]

Recent outside
screening mammogram (reportedly negative) not available for
comparison.
FINDINGS: On physical exam,thickening in the RIGHT axilla is noted.

Ultrasound is performed, showing normal fibroglandular tissue in the
RIGHT axilla spanning a distance of approximately 5 cm and
corresponding to the area of palpable thickening. No solid or cystic
mass, or enlarged lymph nodes in the RIGHT axilla are noted.
IMPRESSION: Moderate amount of normal fibroglandular tissue in the RIGHT axilla
without other significant abnormality.

RECOMMENDATION:
Patient has an existing surgical appointment later today to discuss
possible excision.

Bilateral screening mammogram at time of annual schedule.

I have discussed the findings and recommendations with the patient.
If applicable, a reminder letter will be sent to the patient
regarding the next appointment.

BI-RADS CATEGORY  2: Benign.
